# Patient Record
Sex: Female | Born: 1985 | Race: White | Hispanic: No | Marital: Single | Smoking: Current every day smoker
Health system: Southern US, Community
[De-identification: ages and names within clinical notes are randomized; demographics above are authoritative.]

## PROBLEM LIST (undated history)

## (undated) DIAGNOSIS — F32A Depression, unspecified: Secondary | ICD-10-CM

## (undated) DIAGNOSIS — F329 Major depressive disorder, single episode, unspecified: Secondary | ICD-10-CM

---

## 2007-04-11 ENCOUNTER — Encounter: Payer: Self-pay | Admitting: Family Medicine

## 2007-04-11 ENCOUNTER — Ambulatory Visit: Payer: Self-pay | Admitting: Family Medicine

## 2007-04-11 LAB — CONVERTED CEMR LAB
Basophils Absolute: 0 10*3/uL (ref 0.0–0.1)
Eosinophils Absolute: 0 10*3/uL (ref 0.0–0.7)
Eosinophils Relative: 0 % (ref 0–5)
HCT: 38.6 % (ref 36.0–46.0)
Hemoglobin: 12.9 g/dL (ref 12.0–15.0)
Lymphocytes Relative: 24 % (ref 12–46)
Lymphs Abs: 2 10*3/uL (ref 0.7–4.0)
MCV: 87.5 fL (ref 78.0–100.0)
Monocytes Absolute: 0.5 10*3/uL (ref 0.1–1.0)
RDW: 12.8 % (ref 11.5–15.5)
Rh Type: POSITIVE
Sickle Cell Screen: NEGATIVE

## 2007-04-18 ENCOUNTER — Other Ambulatory Visit: Admission: RE | Admit: 2007-04-18 | Discharge: 2007-04-18 | Payer: Self-pay | Admitting: Family Medicine

## 2007-04-18 ENCOUNTER — Encounter: Payer: Self-pay | Admitting: Family Medicine

## 2007-04-18 ENCOUNTER — Ambulatory Visit: Payer: Self-pay | Admitting: Sports Medicine

## 2007-04-18 DIAGNOSIS — F17200 Nicotine dependence, unspecified, uncomplicated: Secondary | ICD-10-CM | POA: Insufficient documentation

## 2007-04-19 ENCOUNTER — Ambulatory Visit (HOSPITAL_COMMUNITY): Admission: RE | Admit: 2007-04-19 | Discharge: 2007-04-19 | Payer: Self-pay | Admitting: Family Medicine

## 2007-04-19 ENCOUNTER — Encounter: Payer: Self-pay | Admitting: Family Medicine

## 2007-04-25 ENCOUNTER — Encounter: Payer: Self-pay | Admitting: Family Medicine

## 2007-05-17 ENCOUNTER — Ambulatory Visit: Payer: Self-pay | Admitting: Family Medicine

## 2007-05-17 LAB — CONVERTED CEMR LAB
Glucose, Urine, Semiquant: NEGATIVE
Protein, U semiquant: NEGATIVE

## 2007-05-22 ENCOUNTER — Encounter: Payer: Self-pay | Admitting: Family Medicine

## 2007-06-20 ENCOUNTER — Ambulatory Visit: Payer: Self-pay | Admitting: Family Medicine

## 2007-06-20 LAB — CONVERTED CEMR LAB: Glucose, Urine, Semiquant: NEGATIVE

## 2007-07-19 ENCOUNTER — Ambulatory Visit: Payer: Self-pay | Admitting: Family Medicine

## 2007-07-19 LAB — CONVERTED CEMR LAB: Protein, U semiquant: NEGATIVE

## 2007-07-21 ENCOUNTER — Encounter: Payer: Self-pay | Admitting: Family Medicine

## 2007-07-21 ENCOUNTER — Ambulatory Visit: Payer: Self-pay | Admitting: Family Medicine

## 2007-07-21 LAB — CONVERTED CEMR LAB
MCHC: 32.7 g/dL (ref 30.0–36.0)
MCV: 92.7 fL (ref 78.0–100.0)
Platelets: 277 10*3/uL (ref 150–400)
RDW: 14.8 % (ref 11.5–15.5)

## 2007-07-22 ENCOUNTER — Encounter: Payer: Self-pay | Admitting: Family Medicine

## 2007-08-15 ENCOUNTER — Ambulatory Visit: Payer: Self-pay | Admitting: Family Medicine

## 2007-09-14 ENCOUNTER — Ambulatory Visit: Payer: Self-pay | Admitting: Family Medicine

## 2007-09-21 ENCOUNTER — Ambulatory Visit: Payer: Self-pay | Admitting: Family Medicine

## 2007-09-21 ENCOUNTER — Encounter: Payer: Self-pay | Admitting: Family Medicine

## 2007-09-21 LAB — CONVERTED CEMR LAB
Chlamydia, DNA Probe: NEGATIVE
Glucose, Urine, Semiquant: NEGATIVE

## 2007-09-22 ENCOUNTER — Encounter: Payer: Self-pay | Admitting: Family Medicine

## 2007-09-26 ENCOUNTER — Ambulatory Visit (HOSPITAL_COMMUNITY): Admission: RE | Admit: 2007-09-26 | Discharge: 2007-09-26 | Payer: Self-pay | Admitting: Family Medicine

## 2007-09-28 ENCOUNTER — Ambulatory Visit: Payer: Self-pay | Admitting: Family Medicine

## 2007-09-28 LAB — CONVERTED CEMR LAB
Glucose, Urine, Semiquant: NEGATIVE
Protein, U semiquant: NEGATIVE

## 2007-09-29 ENCOUNTER — Encounter: Payer: Self-pay | Admitting: Family Medicine

## 2007-10-06 ENCOUNTER — Ambulatory Visit: Payer: Self-pay | Admitting: Family Medicine

## 2007-10-06 ENCOUNTER — Encounter: Payer: Self-pay | Admitting: Family Medicine

## 2007-10-13 ENCOUNTER — Inpatient Hospital Stay (HOSPITAL_COMMUNITY): Admission: AD | Admit: 2007-10-13 | Discharge: 2007-10-13 | Payer: Self-pay | Admitting: Obstetrics & Gynecology

## 2007-10-13 ENCOUNTER — Ambulatory Visit: Payer: Self-pay | Admitting: Family Medicine

## 2007-10-13 LAB — CONVERTED CEMR LAB: Glucose, Urine, Semiquant: NEGATIVE

## 2007-10-17 ENCOUNTER — Ambulatory Visit: Payer: Self-pay | Admitting: Family Medicine

## 2007-10-19 ENCOUNTER — Inpatient Hospital Stay (HOSPITAL_COMMUNITY): Admission: AD | Admit: 2007-10-19 | Discharge: 2007-10-19 | Payer: Self-pay | Admitting: Obstetrics & Gynecology

## 2007-10-19 ENCOUNTER — Ambulatory Visit: Payer: Self-pay | Admitting: Obstetrics & Gynecology

## 2007-10-20 ENCOUNTER — Inpatient Hospital Stay (HOSPITAL_COMMUNITY): Admission: AD | Admit: 2007-10-20 | Discharge: 2007-10-23 | Payer: Self-pay | Admitting: Obstetrics & Gynecology

## 2007-10-20 ENCOUNTER — Ambulatory Visit: Payer: Self-pay | Admitting: Advanced Practice Midwife

## 2007-11-27 ENCOUNTER — Ambulatory Visit: Payer: Self-pay | Admitting: Family Medicine

## 2007-11-27 LAB — CONVERTED CEMR LAB: Beta hcg, urine, semiquantitative: NEGATIVE

## 2007-12-13 ENCOUNTER — Encounter: Payer: Self-pay | Admitting: Family Medicine

## 2008-01-02 ENCOUNTER — Ambulatory Visit: Payer: Self-pay | Admitting: Family Medicine

## 2008-02-05 ENCOUNTER — Encounter: Payer: Self-pay | Admitting: Family Medicine

## 2008-02-09 ENCOUNTER — Telehealth: Payer: Self-pay | Admitting: *Deleted

## 2008-05-08 ENCOUNTER — Encounter: Payer: Self-pay | Admitting: Family Medicine

## 2008-05-08 ENCOUNTER — Ambulatory Visit: Payer: Self-pay | Admitting: Family Medicine

## 2008-05-08 LAB — CONVERTED CEMR LAB: Rapid Strep: NEGATIVE

## 2008-11-05 ENCOUNTER — Emergency Department (HOSPITAL_COMMUNITY): Admission: EM | Admit: 2008-11-05 | Discharge: 2008-11-05 | Payer: Self-pay | Admitting: Emergency Medicine

## 2009-03-12 ENCOUNTER — Ambulatory Visit: Payer: Self-pay | Admitting: Family Medicine

## 2009-03-12 ENCOUNTER — Encounter: Payer: Self-pay | Admitting: Family Medicine

## 2009-03-12 DIAGNOSIS — F330 Major depressive disorder, recurrent, mild: Secondary | ICD-10-CM

## 2009-03-12 LAB — CONVERTED CEMR LAB
MCHC: 32 g/dL (ref 30.0–36.0)
RBC: 4.66 M/uL (ref 3.87–5.11)
TSH: 1.242 microintl units/mL (ref 0.350–4.500)
Vitamin B-12: 520 pg/mL (ref 211–911)
WBC: 11.1 10*3/uL — ABNORMAL HIGH (ref 4.0–10.5)

## 2009-03-13 ENCOUNTER — Telehealth: Payer: Self-pay | Admitting: Family Medicine

## 2009-04-04 ENCOUNTER — Ambulatory Visit: Payer: Self-pay | Admitting: Family Medicine

## 2009-05-07 ENCOUNTER — Telehealth: Payer: Self-pay | Admitting: *Deleted

## 2009-11-20 ENCOUNTER — Telehealth: Payer: Self-pay | Admitting: Family Medicine

## 2010-02-10 NOTE — Assessment & Plan Note (Signed)
Summary: depression   Vital Signs:  Patient profile:   25 year old female Height:      62 inches Weight:      165.7 pounds BMI:     30.42 Temp:     98.0 degrees F oral Pulse rate:   69 / minute BP sitting:   137 / 84  (left arm) Cuff size:   regular  Vitals Entered By: Gladstone Pih (April 04, 2009 11:26 AM) CC: depression Is Patient Diabetic? No Pain Assessment Patient in pain? no        Primary Care Provider:  Romero Belling MD  CC:  depression.  History of Present Illness: Taking Citalopram as prescribed without side effects--currently taking 20 mg daily.  Filled Clonazepam prescription but has not taken any.  Feels mood is much improved.  Denies sadness, crying, irritability, anger, anhedonia, sleep disturbance, suicidal ideation.  Is enjoying her 65 mo son "LD" (Little Dwayne).  Habits & Providers  Alcohol-Tobacco-Diet     Tobacco Status: current     Tobacco Counseling: to quit use of tobacco products     Cigarette Packs/Day: <0.25  Current Medications (verified): 1)  Sprintec 28 0.25-35 Mg-Mcg Tabs (Norgestimate-Eth Estradiol) .Marland Kitchen.. 1 Tab Daily As Directed On Package.  Disp #1 Package. 2)  Citalopram Hydrobromide 20 Mg Tabs (Citalopram Hydrobromide) .Marland Kitchen.. 1 Tab By Mouth Daily 3)  Clonazepam 1 Mg Tabs (Clonazepam) .Marland Kitchen.. 1 Tab By Mouth Two Times A Day As Needed For Anxiety  Allergies (verified): No Known Drug Allergies  Social History: Packs/Day:  <0.25  Physical Exam  General:  Well-developed,well-nourished,in no acute distress; alert,appropriate and cooperative throughout examination Psych:  Cognition and judgment appear intact. Alert and cooperative with normal attention span and concentration. No apparent delusions, illusions, hallucinations.  Not anxious appearing and not depressed appearing.   Impression & Recommendations:  Problem # 1:  DEPRESSION, MAJOR, INITIAL EPISODE (ICD-296.20) Assessment Improved  Started treatment with Citalopram 1 month ago.   Is basically asymptomatic at this point.  This is a very fast response to medication, so wonder if something else changed.  Did start OCPs 1 month ago--perhaps hormones were contributing?  Either way, would continue treatment for 9-12 months, then wean Citalopram.  No use of Clonazepam, so would not refill.  Orders: [facility]- Est Level  2 (16109)  Problem # 2:  Preventive Health Care (ICD-V70.0) Assessment: Comment Only RTC 2-3 months for Pap.  Complete Medication List: 1)  Sprintec 28 0.25-35 Mg-mcg Tabs (Norgestimate-eth estradiol) .Marland Kitchen.. 1 tab daily as directed on package.  disp #1 package. 2)  Citalopram Hydrobromide 20 Mg Tabs (Citalopram hydrobromide) .Marland Kitchen.. 1 tab by mouth daily

## 2010-02-10 NOTE — Assessment & Plan Note (Signed)
Summary: depression, birth control   Vital Signs:  Patient profile:   25 year old female Height:      62 inches Weight:      166 pounds BMI:     30.47 BSA:     1.77 Temp:     98.6 degrees F Pulse rate:   74 / minute BP sitting:   123 / 78  Vitals Entered By: Jone Baseman CMA (March 12, 2009 2:23 PM) CC: depression, birth control Is Patient Diabetic? No Pain Assessment Patient in pain? no        CC:  depression and birth control.  History of Present Illness: 25 yo female:  DEPRESSION/ANXIETY.  3 month history of depressed mood and anxiety with frequent crying.  She states it started suddenly and initially she sat in bed for 2 weeks.  Associated with decreased motivation and insomnia.  Denies anhedonia, appetite changes, manic symptoms.  She endorses thoughts of self harm involving cutting herself, but hasn't done this.  Does not have an active suicide plan and states she would never harm herself.  When asked what she would do if these feelings became more intense, she states she would tell Dwayne (her partner).  See PHQ9 and GAD7 in physical exam.  BIRTH CONTROL.  Does not want to have another baby any time in the near future.  Has not been using OCPs for several months and she is sexually active with one partner and they are not using condoms.  She has regular periods every month that last approximately 7 days, and she is at beginning of period right now and UPT in clinic is negative today.  Does not want Depo or IUD.  Is requesting refill of OCPs today.  The reason she stopped taking in the past was financial.  ** GREATER THAN 25 MINUTES SPENT, MORE THAN 50% IN FACE-TO-FACE COUNSELLING. `  Habits & Providers  Alcohol-Tobacco-Diet     Tobacco Status: current     Cigarette Packs/Day: occ  Allergies (verified): No Known Drug Allergies  Physical Exam  Additional Exam:  VITALS:  Reviewed, normal GEN: Alert & oriented, no acute distress NECK: Midline trachea, no  masses/thyromegaly, no cervical lymphadenopathy CARDIO: Regular rate and rhythm, no murmurs/rubs/gallops, 2+ bilateral radial pulses RESP: Clear to auscultation, normal work of breathing, no retractions/accessory muscle use ABD: Normoactive bowel sounds, nontender, no masses/hepatosplenomegaly EXT: Nontender, no edema PSYCH:  Normal affect, mood = "sad"  PHQ 9 1 - 1 2 - 1 3 - 3 4 - 1 5 - 1 6 - 1 7 - 0 8 - 1 9 - 1  SUICIDAL?? -- NO TOTAL:  10 10 - DIFFICULTY = SOMEWHAT  GAD 7 1 - 1 2 - 1 3 - 1 4 - 1 5 - 1 6 - 1 7 - 1 TOTAL:  7 DIFFICULTY = SOMEWHAT    Impression & Recommendations:  Problem # 1:  MOOD DISORDER (ICD-296.90) Assessment New Likely major depressive disorder, 1st episode.  Possible anxiety component.  PHQ9 = 10, GAD7 = 7.  Check following labs:  TSH, CBC, B12.  Initiate Citalopram with Clonazepam bridge.  Discussed delayed onset of Citalopram effets on mood, as well as increased risk of suicidality and mania with Citalopram.  Discussed need to taper off Clonazepam as soon as possible when Citalopram is therapeutic.  Follow up in 2 weeks.  Orders: [facility]- Est  Level 4 (02725)  Problem # 2:  UNSPECIFIED CONTRACEPTIVE MANAGEMENT (ICD-V25.9) Assessment: Deteriorated Does not want  IUD or Depo.  Does not want more children for several years.  Refilled OCPs today.  Is an intermittent smoker, so this is not ideal, but given age is acceptable.  Will discuss more long term methods of birth control when emotionally more stable. Orders: [facility]- Est  Level 4 (99214)  Complete Medication List: 1)  Sprintec 28 0.25-35 Mg-mcg Tabs (Norgestimate-eth estradiol) .Marland Kitchen.. 1 tab daily as directed on package.  disp #1 package. 2)  Citalopram Hydrobromide 20 Mg Tabs (Citalopram hydrobromide) .Marland Kitchen.. 1 tab by mouth daily 3)  Clonazepam 1 Mg Tabs (Clonazepam) .Marland Kitchen.. 1 tab by mouth two times a day as needed for anxiety  Other Orders: CBC-[facility] (16109) B12-[facility] ([address]11) TSH-[facility]  ([address]78-29[address]) U Preg-[facility] (13086)  Patient Instructions: 1)  Start Citalopram today:  1/2 tablet for the first 6 days, then 1 tablet every day. 2)  Use Clonazepam as needed, try not to use every day.  This is a medicine that we will want to stop sometime in the next few weeks. 3)  Start birth control pills on Sunday. 4)  Please schedule a follow-up appointment in 2 weeks.  Prescriptions: CLONAZEPAM 1 MG TABS (CLONAZEPAM) 1 tab by mouth two times a day as needed for anxiety  #60 x 0   Entered and Authorized by:   Tihanna Goodson MD   Signed by:   Lashona Schaaf MD on 03/12/2009   Method used:   Print then Give to Patient   RxID:   [id number] SPRINTEC 28 0.25-35 MG-MCG TABS (NORGESTIMATE-ETH ESTRADIOL) 1 tab daily as directed on package.  Disp #1 package.  #1 x 11   Entered and Authorized by:   Raffaella Edison MD   Signed by:   Masud Holub MD on 03/12/2009   Method used:   Electronically to        [facility]  [address] (retail)       [address]       [place]       [place],   [address]       Ph: [phone]       Fax: [phone]   RxID:   [id number] CITALOPRAM HYDROBROMIDE 20 MG TABS (CITALOPRAM HYDROBROMIDE) 1 tab by mouth daily  #30 x 1   Entered and Authorized by:   Yuvan Medinger MD   Signed by:   Breton Berns MD on 03/12/2009   Method used:   Electronically to        [facility]  [address] (retail)       [address]       [place], [place]  [address]       Ph: [phone]       Fax: [phone]   RxID:   [id number]   Laboratory Results   Urine Tests  Date/Time Received: March 12, 2009 3:57 PM  Date/Time Reported: March 12, 2009 4:53 PM     Urine HCG: negative Comments: ...............test performed by......Marland KitchenBonnie A. [place], MLS (ASCP)cm

## 2010-02-10 NOTE — Progress Notes (Signed)
Summary: refill  Phone Note Refill Request Call back at [phone] Message from:  Patient  Refills Requested: Medication #1:  CITALOPRAM HYDROBROMIDE 20 MG TABS 1 tab by mouth daily. Initial call taken by: De Nurse,  November 20, 2009 11:05 AM    Prescriptions: CITALOPRAM HYDROBROMIDE 20 MG TABS (CITALOPRAM HYDROBROMIDE) 1 tab by mouth daily  #30 x 6   Entered and Authorized by:   Angelena Sole MD   Signed by:   Angelena Sole MD on 11/20/2009   Method used:   Electronically to        [facility] (retail)       [address]       [place], [place]  [address]       Ph: [phone]       Fax: [phone]   RxID:   [id number]
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
## 2010-02-10 NOTE — Progress Notes (Signed)
Summary: refill  Phone Note Refill Request Call back at [phone] Message from:  Patient  Refills Requested: Medication #1:  CITALOPRAM HYDROBROMIDE 20 MG TABS 1 tab by mouth daily. Initial call taken by: De Nurse,  May 07, 2009 12:18 PM  Follow-up for Phone Call        [facility]--[facility]  Please call patient. Follow-up by: Romero Belling MD,  May 07, 2009 12:30 PM  Additional Follow-up for Phone Call Additional follow up Details #1::        Pt informed Additional Follow-up by: Jone Baseman CMA,  May 07, 2009 1:34 PM    Prescriptions: CITALOPRAM HYDROBROMIDE 20 MG TABS (CITALOPRAM HYDROBROMIDE) 1 tab by mouth daily  #30 x 6   Entered and Authorized by:   Romero Belling MD   Signed by:   Romero Belling MD on 05/07/2009   Method used:   Electronically to        [facility] (retail)       [address]       [place], [place]  [address]       Ph: [phone]       Fax: [phone]   RxID:   [id number]

## 2010-06-30 ENCOUNTER — Ambulatory Visit (INDEPENDENT_AMBULATORY_CARE_PROVIDER_SITE_OTHER): Payer: Medicaid Other | Admitting: Family Medicine

## 2010-06-30 ENCOUNTER — Encounter: Payer: Self-pay | Admitting: Family Medicine

## 2010-06-30 VITALS — BP 119/73 | HR 79 | Temp 97.7°F | Wt 197.0 lb

## 2010-06-30 DIAGNOSIS — F329 Major depressive disorder, single episode, unspecified: Secondary | ICD-10-CM

## 2010-06-30 DIAGNOSIS — F17200 Nicotine dependence, unspecified, uncomplicated: Secondary | ICD-10-CM

## 2010-06-30 DIAGNOSIS — Z309 Encounter for contraceptive management, unspecified: Secondary | ICD-10-CM

## 2010-06-30 DIAGNOSIS — F419 Anxiety disorder, unspecified: Secondary | ICD-10-CM | POA: Insufficient documentation

## 2010-06-30 DIAGNOSIS — Z3042 Encounter for surveillance of injectable contraceptive: Secondary | ICD-10-CM | POA: Insufficient documentation

## 2010-06-30 DIAGNOSIS — F411 Generalized anxiety disorder: Secondary | ICD-10-CM

## 2010-06-30 MED ORDER — MEDROXYPROGESTERONE ACETATE 150 MG/ML IM SUSP
150.0000 mg | Freq: Once | INTRAMUSCULAR | Status: AC
  Administered 2010-06-30: 150 mg via INTRAMUSCULAR

## 2010-06-30 MED ORDER — MEDROXYPROGESTERONE ACETATE 150 MG/ML IM SUSP: 150.0000 mg | INTRAMUSCULAR | Status: DC

## 2010-06-30 MED ORDER — CITALOPRAM HYDROBROMIDE 40 MG PO TABS: 40.0000 mg | ORAL_TABLET | Freq: Every day | ORAL | Status: DC

## 2010-06-30 NOTE — Assessment & Plan Note (Signed)
Encouraged her to quit.  She will try.

## 2010-06-30 NOTE — Assessment & Plan Note (Signed)
Relatively stable.  Still having some thoughts of self harm.  Will increase dose of Citalopram.

## 2010-06-30 NOTE — Patient Instructions (Signed)
It was good to see you today We will increase the Citalopram to 40mg  daily.  This should help with the depression and the anxiety. We will start you on Depo today. Please schedule a follow up appointment when convenient for your Pap smear. Please schedule a follow up appointment in 3 months to check on your mood and for your next Depo injection.

## 2010-06-30 NOTE — Assessment & Plan Note (Signed)
Has had this before.  Sounds like she was on a benzo.  Will try increased dose of Celexa and see if that helps.

## 2010-06-30 NOTE — Progress Notes (Signed)
  Subjective:    Patient ID: Cassandra Bass, female    DOB: 1985/06/20, 25 y.o.   MRN: [id number]  HPI 1. Depression:  It is relatively stable.  She still endorses occassional thoughts of self harm.  Denies any SI.    She has a good support system and it helps to talk to people about her issues  2. Anxiety:  She is starting to have more anxiety problems.  She was started on another medication for this about 1.5 years ago and it made a big difference.  She is interested in starting that again.  3. Birth control:  She is not taking anything would like to start Depo.  She is on her period now.  4. Tobacco use:  She only smokes 1 or 2 times a week.  Only smokes when people around her smoke.  She is interested in quitting.   Review of Systems Denies SI.  Denies problems with sleep.  Endorses low energy.  Denies tachycardia.  Denies shortness of breath or cough    Objective:   Physical Exam  Vitals reviewed. Constitutional: She appears well-nourished. No distress.       Obese   HENT:  Mouth/Throat: Oropharynx is clear and moist. No oropharyngeal exudate.  Neck: Normal range of motion. Neck supple. No thyromegaly present.  Cardiovascular: Normal rate, regular rhythm and normal heart sounds.   Pulmonary/Chest: Effort normal and breath sounds normal. No respiratory distress. She has no wheezes.  Abdominal: Soft. Bowel sounds are normal. She exhibits no distension. There is no tenderness.  Musculoskeletal: She exhibits no edema.  Skin: Skin is warm and dry.  Psychiatric: She has a normal mood and affect.       Full affect          Assessment & Plan:

## 2010-10-13 LAB — CBC
MCHC: 32.7
RBC: 4.64
RDW: 13.6

## 2010-10-13 LAB — RPR: RPR Ser Ql: NONREACTIVE

## 2010-11-12 NOTE — Progress Notes (Signed)
  Subjective:    Patient ID: Cassandra Bass, female    DOB: 09/05/1985, 25 y.o.   MRN: [id number]  HPI   Review of Systems     Objective:   Physical Exam        Assessment & Plan:

## 2010-11-13 ENCOUNTER — Ambulatory Visit (INDEPENDENT_AMBULATORY_CARE_PROVIDER_SITE_OTHER): Payer: Self-pay | Admitting: Family Medicine

## 2010-11-13 DIAGNOSIS — Z0000 Encounter for general adult medical examination without abnormal findings: Secondary | ICD-10-CM

## 2010-11-23 ENCOUNTER — Ambulatory Visit: Payer: Self-pay

## 2010-12-16 ENCOUNTER — Encounter: Payer: Self-pay | Admitting: Family Medicine

## 2011-04-03 ENCOUNTER — Encounter (HOSPITAL_COMMUNITY): Payer: Self-pay | Admitting: Family Medicine

## 2011-04-03 ENCOUNTER — Emergency Department (HOSPITAL_COMMUNITY)
Admission: EM | Admit: 2011-04-03 | Discharge: 2011-04-03 | Disposition: A | Discharge status: Other Acute Inpatient Hospital | Payer: Self-pay | Attending: Emergency Medicine | Admitting: Emergency Medicine

## 2011-04-03 DIAGNOSIS — Z79899 Other long term (current) drug therapy: Secondary | ICD-10-CM | POA: Insufficient documentation

## 2011-04-03 DIAGNOSIS — F1010 Alcohol abuse, uncomplicated: Secondary | ICD-10-CM | POA: Insufficient documentation

## 2011-04-03 DIAGNOSIS — R456 Violent behavior: Secondary | ICD-10-CM

## 2011-04-03 DIAGNOSIS — F17200 Nicotine dependence, unspecified, uncomplicated: Secondary | ICD-10-CM | POA: Insufficient documentation

## 2011-04-03 DIAGNOSIS — F10929 Alcohol use, unspecified with intoxication, unspecified: Secondary | ICD-10-CM

## 2011-04-03 HISTORY — DX: Depression, unspecified: F32.A

## 2011-04-03 HISTORY — DX: Major depressive disorder, single episode, unspecified: F32.9
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
## 2011-04-03 NOTE — ED Notes (Signed)
Patient ambulated to the bathroom. Urine specimen cup given to patient. Patient refused to provide urine specimen.
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
## 2011-04-03 NOTE — ED Notes (Signed)
Dr. Molpus at bedside. 

## 2011-04-03 NOTE — Discharge Instructions (Signed)
 Alcohol Intoxication   You have alcohol intoxication when the amount of alcohol that you have consumed has impaired your ability to mentally and physically function. There are a variety of factors that contribute to the level at which alcohol intoxication can occur, such as age, gender, weight, frequency of alcohol consumption, medication use, and the presence of other medical conditions, such as diabetes, seizures, or heart conditions.   The blood alcohol level test measures the concentration of alcohol in your blood. In most states, your blood alcohol level must be lower than 80 mg/dL (1.61%) to legally drive. However, many dangerous effects of alcohol can occur at much lower levels.   Alcohol directly impairs the normal chemical activity of the brain and is said to be a chemical depressant. Alcohol can cause drowsiness, stupor, respiratory failure, and coma. Other physical effects can include headache, vomiting, vomiting of blood, abdominal pain, a fast heartbeat, difficulty breathing, anxiety, and amnesia. Alcohol intoxication can also lead to dangerous and life-threatening activities, such as fighting, dangerous operation of vehicles or heavy machinery, and risky sexual behavior.   Alcohol can be especially dangerous when taken with other drugs. Some of these drugs are:   Sedatives.   Painkillers.   Marijuana.   Tranquilizers.   Antihistamines.   Muscle relaxants.   Seizure medicine.  Many of the effects of acute alcohol intoxication are temporary. However, repeated alcohol intoxication can lead to severe medical illnesses. If you have alcohol intoxication, you should:   Stay hydrated. Drink enough water and fluids to keep your urine clear or pale yellow. Avoid excessive caffeine because this can further lead to dehydration.   Eat a healthy diet. You may have residual nausea, headache, and loss of appetite, but it is still important that you maintain good nutrition. You can start with clear liquids.   Take  nonsteroidal anti-inflammatory medications as needed for headaches, but make sure to do so with small meals. You should avoid acetaminophen for several days after having alcohol intoxication because the combination of alcohol and acetaminophen can be toxic to your liver.  If you have frequent alcohol intoxication, ask your friends and family if they think you have a drinking problem. For further help, contact:   Your caregiver.   Alcoholics Anonymous (AA).   A drug or alcohol rehabilitation program.  SEEK MEDICAL CARE IF:   You have persistent vomiting.   You have persistent pain in any part of your body.   You do not feel better after a few days.  SEEK IMMEDIATE MEDICAL CARE IF:   You become shaky or tremble when you try to stop drinking.   You shake uncontrollably (seizure).   You throw up (vomit) blood. This may be bright red or it may look like black coffee grounds.   You have blood in the stool. This may be bright red or appear as a black, tarry, bad smelling stool.   You become lightheaded or faint.  ANY OF THESE SYMPTOMS MAY REPRESENT A SERIOUS PROBLEM THAT IS AN EMERGENCY. Do not wait to see if the symptoms will go away. Get medical help right away. Call your local emergency services (911 in [place] DO NOT drive yourself to the hospital.   MAKE SURE YOU:   Understand these instructions.   Will watch your condition.   Will get help right away if you are not doing well or get worse.  Document Released: 10/07/2004 Document Revised: 12/17/2010 Document Reviewed: 06/16/2009   [facility] Patient Information 2012 [place], [place].

## 2011-04-05 ENCOUNTER — Telehealth: Payer: Self-pay | Admitting: Family Medicine

## 2011-04-05 DIAGNOSIS — F329 Major depressive disorder, single episode, unspecified: Secondary | ICD-10-CM

## 2011-04-05 MED ORDER — CITALOPRAM HYDROBROMIDE 40 MG PO TABS: 40.0000 mg | ORAL_TABLET | Freq: Every day | ORAL | Status: DC

## 2011-04-05 NOTE — Telephone Encounter (Signed)
Dr. Armen Pickup is on vacation . Will check with preceptor about sending in new RX.

## 2011-04-05 NOTE — Telephone Encounter (Signed)
Will forward message to Dr. Funches .  

## 2011-04-05 NOTE — Telephone Encounter (Signed)
Patient is calling because she left what she had left of her Citalopram at her dads who lives [place] of [place].  She went to her pharmacy yesterday but they said she didn't have anymore refills left.  She needs a new Rx for her medication and is concerned because she feel funny and like she could have an anxiety attack.

## 2011-04-05 NOTE — Telephone Encounter (Signed)
Dr. Mauricio Po advises may send in .  Advised patient to schedule appointment before next refill is needed.

## 2011-05-03 ENCOUNTER — Telehealth: Payer: Self-pay | Admitting: Family Medicine

## 2011-05-03 DIAGNOSIS — F329 Major depressive disorder, single episode, unspecified: Secondary | ICD-10-CM

## 2011-05-03 MED ORDER — CITALOPRAM HYDROBROMIDE 40 MG PO TABS: 40.0000 mg | ORAL_TABLET | Freq: Every day | ORAL | Status: DC

## 2011-05-03 NOTE — Telephone Encounter (Signed)
Patient notified that enough was sent in to last until appointment.

## 2011-05-03 NOTE — Telephone Encounter (Signed)
Patient needs a refill on her Celexa sent to [facility].  She has an appt with Dr. Armen Pickup on 5/3.  Please call patient if there are any question or to let her know that this has been completed.

## 2011-05-14 ENCOUNTER — Encounter: Payer: Self-pay | Admitting: Family Medicine

## 2011-05-14 ENCOUNTER — Ambulatory Visit (INDEPENDENT_AMBULATORY_CARE_PROVIDER_SITE_OTHER): Payer: Medicaid Other | Admitting: Family Medicine

## 2011-05-14 VITALS — BP 135/80

## 2011-05-14 DIAGNOSIS — F411 Generalized anxiety disorder: Secondary | ICD-10-CM

## 2011-05-14 DIAGNOSIS — F329 Major depressive disorder, single episode, unspecified: Secondary | ICD-10-CM

## 2011-05-14 DIAGNOSIS — F419 Anxiety disorder, unspecified: Secondary | ICD-10-CM

## 2011-05-14 MED ORDER — CITALOPRAM HYDROBROMIDE 40 MG PO TABS: 40.0000 mg | ORAL_TABLET | Freq: Every day | ORAL | Status: DC

## 2011-05-14 MED ORDER — BUPROPION HCL 100 MG PO TABS: 100.0000 mg | ORAL_TABLET | Freq: Two times a day (BID) | ORAL | Status: DC

## 2011-05-14 NOTE — Progress Notes (Signed)
Subjective:     Patient ID: Cassandra Bass, female   DOB: 1985-01-17, 26 y.o.   MRN: [id number]  HPI 26 yo G here for depression and anxiety f/u:  1. Depression: taking celexa 40. Admits to thoughts of suicide. Denies plan. Sleeps poorly about 4-5 hrs per night. Interrupted. Enjoys spending time with family and friends. Has support from son and mom. Smokes 2 cigarettes per day. Drinks 2 beers per week. Denies illicit drug use.   2. Anxiety: has attacks randomly. Tried xanax when she was initially diagnosed which helped. Has not taken xanax recently. Admits to CP, SOB, sweating during attacks.   Review of Systems As per HPI    Objective:   Physical Exam There were no vitals taken for this visit. General appearance: alert, cooperative and no distress Psych: PHQ- 9 filled out. Score 8. Score of 1 to question 1, 2, 4, 6, and 9. Score of 3 to question 3.     Assessment and Plan:

## 2011-05-14 NOTE — Assessment & Plan Note (Signed)
A: persistent. Patient interested in restarting BZ.  P: -wellbutrin -discouraged BZ, will not prescribe given random onset of attacks. I suspect that she will end of taking them more often than desired like daily.  -encouraged physical activity -f/u in 2-4 weeks.

## 2011-05-14 NOTE — Patient Instructions (Signed)
Cassandra Bass,  Thank you very much for coming to see me today. It was a pleasure meeting you.  Please continue Celexa 40 mg daily.  Start Wellbutrin 100 mg twice daily, after 3 days you can increase to three times daily if you wish.  In addition: continue healthy weight loss, be active outdoors with family, make sure you eat well. Do you best to stop smoking all together. Limit alcohol.   F/u with me in 2-4 weeks  Dr. Armen Pickup

## 2011-05-14 NOTE — Assessment & Plan Note (Signed)
A: major depression not yet in remittance. Biggest problems are insomnia, suicidal thoughts and concomitant anxiety. P: -start wellbutrin -patient declines therapy at this time -encouraged physical activity and enjoyable stimulating activities -f/u in 2-4 weeks.

## 2011-05-17 ENCOUNTER — Telehealth: Payer: Self-pay | Admitting: Family Medicine

## 2011-05-17 NOTE — Telephone Encounter (Signed)
Will forward message to MD.

## 2011-05-17 NOTE — Telephone Encounter (Signed)
States that the Wellbutrin is too expensive and needs something cheaper  [facility] [place] [place]

## 2011-05-20 MED ORDER — AMITRIPTYLINE HCL 75 MG PO TABS: 75.0000 mg | ORAL_TABLET | Freq: Every day | ORAL | Status: DC

## 2011-05-20 NOTE — Telephone Encounter (Signed)
Called patient. Changed to elavil. Patient can pick up elavil at her convenience. Will keep dose low since she is also taking celexa. Warned against dizziness/lightheadedness at night.

## 2011-06-03 ENCOUNTER — Ambulatory Visit: Payer: Self-pay | Admitting: Family Medicine

## 2011-06-11 ENCOUNTER — Encounter: Payer: Self-pay | Admitting: Family Medicine

## 2011-09-16 ENCOUNTER — Encounter: Payer: Self-pay | Admitting: Family Medicine

## 2011-09-30 ENCOUNTER — Ambulatory Visit
Admission: RE | Admit: 2011-09-30 | Discharge: 2011-09-30 | Disposition: A | Discharge status: Home / Self Care | Payer: Medicaid Other | Source: Ambulatory Visit | Attending: Family Medicine | Admitting: Family Medicine

## 2011-09-30 ENCOUNTER — Other Ambulatory Visit: Payer: Self-pay | Admitting: Family Medicine

## 2011-09-30 DIAGNOSIS — M79609 Pain in unspecified limb: Secondary | ICD-10-CM

## 2011-09-30 DIAGNOSIS — M545 Low back pain: Secondary | ICD-10-CM

## 2011-09-30 DIAGNOSIS — F17200 Nicotine dependence, unspecified, uncomplicated: Secondary | ICD-10-CM

## 2011-10-04 ENCOUNTER — Encounter: Payer: Self-pay | Admitting: Gastroenterology

## 2011-10-05 ENCOUNTER — Other Ambulatory Visit: Payer: Self-pay | Admitting: Family Medicine

## 2011-10-05 DIAGNOSIS — E049 Nontoxic goiter, unspecified: Secondary | ICD-10-CM

## 2011-10-08 ENCOUNTER — Ambulatory Visit
Admission: RE | Admit: 2011-10-08 | Discharge: 2011-10-08 | Disposition: A | Discharge status: Home / Self Care | Payer: Medicaid Other | Source: Ambulatory Visit | Attending: Family Medicine | Admitting: Family Medicine

## 2011-10-08 DIAGNOSIS — E049 Nontoxic goiter, unspecified: Secondary | ICD-10-CM

## 2011-11-03 ENCOUNTER — Ambulatory Visit: Payer: Medicaid Other | Admitting: Gastroenterology

## 2011-11-08 ENCOUNTER — Encounter: Payer: Medicaid Other | Admitting: Family Medicine

## 2012-01-26 ENCOUNTER — Other Ambulatory Visit (HOSPITAL_COMMUNITY)
Admission: RE | Admit: 2012-01-26 | Discharge: 2012-01-26 | Disposition: A | Discharge status: Home / Self Care | Payer: Medicaid Other | Source: Ambulatory Visit | Attending: Family Medicine | Admitting: Family Medicine

## 2012-01-26 ENCOUNTER — Encounter: Payer: Self-pay | Admitting: Family Medicine

## 2012-01-26 ENCOUNTER — Ambulatory Visit (INDEPENDENT_AMBULATORY_CARE_PROVIDER_SITE_OTHER): Payer: Medicaid Other | Admitting: Family Medicine

## 2012-01-26 VITALS — BP 100/67 | HR 62 | Temp 97.7°F | Ht 62.0 in | Wt 187.0 lb

## 2012-01-26 DIAGNOSIS — L708 Other acne: Secondary | ICD-10-CM

## 2012-01-26 DIAGNOSIS — Z0000 Encounter for general adult medical examination without abnormal findings: Secondary | ICD-10-CM

## 2012-01-26 DIAGNOSIS — Z124 Encounter for screening for malignant neoplasm of cervix: Secondary | ICD-10-CM

## 2012-01-26 DIAGNOSIS — M545 Low back pain: Secondary | ICD-10-CM

## 2012-01-26 DIAGNOSIS — L709 Acne, unspecified: Secondary | ICD-10-CM

## 2012-01-26 DIAGNOSIS — F330 Major depressive disorder, recurrent, mild: Secondary | ICD-10-CM

## 2012-01-26 DIAGNOSIS — Z01419 Encounter for gynecological examination (general) (routine) without abnormal findings: Secondary | ICD-10-CM | POA: Insufficient documentation

## 2012-01-26 DIAGNOSIS — Z113 Encounter for screening for infections with a predominantly sexual mode of transmission: Secondary | ICD-10-CM | POA: Insufficient documentation

## 2012-01-26 DIAGNOSIS — Z3049 Encounter for surveillance of other contraceptives: Secondary | ICD-10-CM

## 2012-01-26 DIAGNOSIS — Z3042 Encounter for surveillance of injectable contraceptive: Secondary | ICD-10-CM

## 2012-01-26 DIAGNOSIS — Z309 Encounter for contraceptive management, unspecified: Secondary | ICD-10-CM

## 2012-01-26 DIAGNOSIS — Z23 Encounter for immunization: Secondary | ICD-10-CM

## 2012-01-26 DIAGNOSIS — N898 Other specified noninflammatory disorders of vagina: Secondary | ICD-10-CM

## 2012-01-26 DIAGNOSIS — F17200 Nicotine dependence, unspecified, uncomplicated: Secondary | ICD-10-CM

## 2012-01-26 LAB — POCT URINE PREGNANCY: Preg Test, Ur: NEGATIVE

## 2012-01-26 MED ORDER — MEDROXYPROGESTERONE ACETATE 150 MG/ML IM SUSP
150.0000 mg | Freq: Once | INTRAMUSCULAR | Status: AC
  Administered 2012-01-26: 150 mg via INTRAMUSCULAR

## 2012-01-26 NOTE — Progress Notes (Signed)
Subjective:     Patient ID: Cassandra Bass, female   DOB: 06-23-1985, 27 y.o.   MRN: [id number]  HPI 27 yo F presents for physical and to discuss the following:  1. Low back pain: she was seen at [facility] clinic for primary care. She was noticed to have scoliosis and started on tylenol #3 for her chronic low back pain. She reports improvement in pain. She denies LE weakness, incontinence or injury to her back.   2.  Grief: her mother passed away recently from stage 4 breast cancer. She died before she start treatment. The patient is worried about her breast cancer risk. She has quit drinking and is working on quitting smoking. She was prescribed xanax at [facility] to help deal with her symptoms of grief and worsening insomnia.   3. Smoking: 1/3 PPD. Contemplative about quitting. Recently quit alcohol. Has intermittent SOB and CP on exertion.   4. Contraception: sexually active. Previously on depo but has not had depo in many months. Currently on her menses.   Review of Systems  General:  Positive for easy bruising. Negative for unexplained weight loss, fever Skin: Negative for new or changing mole, sore that won't heal HEENT: Negative for trouble hearing, trouble seeing, ringing in ears, mouth sores, hoarseness, change in voice, dysphagia. CV: Postive for chest pain, dyspnea, Negative for edema, palpitations Resp: Positive for cough, dyspnea. Negative for  hemoptysis GI: Positive for intermittent lower abdominal pain. Negative for nausea, vomiting, diarrhea, constipation, melena, hematochezia. GU: Negative for dysuria, incontinence, urinary hesitance, hematuria, vaginal or penile discharge, polyuria, sexual difficulty, lumps in testicle or breasts MSK: Positive for low back pain. Negative for joint pain or swelling Neuro: Positive for headaches and seeing spots before her eyes. Negative for weakness, numbness, dizziness, passing out/fainting Psych: Negative for depression,  anxiety, memory problems     Objective:   Physical Exam BP 100/67  Pulse 62  Temp 97.7 F (36.5 C) (Oral)  Ht 5\' 2"  (1.575 m)  Wt 187 lb (84.823 kg)  BMI 34.20 kg/m2  LMP 01/26/2012 General appearance: alert, cooperative and no distress Head: Normocephalic, without obvious abnormality, atraumatic Eyes: conjunctivae/corneas clear. PERRL, EOM's intact. Fundi benign. Ears: normal TM's and external ear canals both ears Nose: Nares normal. Septum midline. Mucosa normal. No drainage or sinus tenderness. Throat: abnormal findings: dentition: multiple carries Neck: no adenopathy, no carotid bruit, no JVD, supple, symmetrical, trachea midline and thyroid not enlarged, symmetric, no tenderness/mass/nodules Back: Mild R thoracic scoliosis Lungs: clear to auscultation bilaterally Breasts: normal appearance, no masses or tenderness, Inspection negative, No nipple retraction or dimpling, No nipple discharge or bleeding, No axillary or supraclavicular adenopathy, Normal to palpation without dominant masses, Taught monthly breast self examination Heart: regular rate and rhythm, S1, S2 normal, no murmur, click, rub or gallop Abdomen: soft, non-tender; bowel sounds normal; no masses,  no organomegaly Pelvic: cervix normal in appearance, external genitalia normal, no adnexal masses or tenderness, no cervical motion tenderness, rectovaginal septum normal, uterus normal size, shape, and consistency and vagina normal without discharge Pap done.  Extremities: extremities normal, atraumatic, no cyanosis or edema Skin: acne on face, back and butt. Bruise on middle L leg Neurologic: Grossly normal  PHQ-9: score of 1. 1 to question 3. Not difficult at all to function. GAD-7: score of 2. 1 to questions 3 and 4. Not difficult at all to function.      Assessment and Plan:

## 2012-01-26 NOTE — Patient Instructions (Addendum)
Cassandra Bass,  Thank you for coming in today.   Your breast exam was normal.  I will call with results of your pap smear. If it is normal you will get another one in 3 years.   I will review your records from [facility].   For acne: Use a cleanser (body wash or bar soap) with salicylic acid.   Regarding xanax and tylenol #3 I recommend discontinuing use. These are not medications that I will refill at this time.  F/u in 4 weeks for additional concerns (bruising)  Plan on repeat physical in one year.   Dr. Armen Pickup

## 2012-01-27 ENCOUNTER — Encounter: Payer: Self-pay | Admitting: Family Medicine

## 2012-01-27 DIAGNOSIS — L709 Acne, unspecified: Secondary | ICD-10-CM | POA: Insufficient documentation

## 2012-01-27 DIAGNOSIS — Z0000 Encounter for general adult medical examination without abnormal findings: Secondary | ICD-10-CM | POA: Insufficient documentation

## 2012-01-27 DIAGNOSIS — M545 Low back pain: Secondary | ICD-10-CM | POA: Insufficient documentation

## 2012-01-27 NOTE — Assessment & Plan Note (Signed)
A: on face and body P: salicylic acid body wash.

## 2012-01-27 NOTE — Assessment & Plan Note (Signed)
A: well controlled on celexa only. Improved PHQ-9 from last assessment. P: continue celexa.

## 2012-01-27 NOTE — Assessment & Plan Note (Signed)
A: chronic. Reassuring history and exam. Patient does have mild scoliosis but no enough to account for degree of pain.  I suspect obesity is the major factor. P: Advised patient to d/c tylenol #3 Will obtain and review images from [facility] clinic. I suspect weight loss and core strengthening will be most beneficial.

## 2012-01-27 NOTE — Assessment & Plan Note (Signed)
A: contemplative about quitting.  P:  Encouraged patient to think about the reasons to quit: decrease cancer risk etc. F/u in 4 weeks.

## 2012-01-27 NOTE — Assessment & Plan Note (Signed)
Flu shot and pap done today.

## 2012-01-27 NOTE — Assessment & Plan Note (Signed)
A: negative U preg today. P: depo administered.

## 2012-01-28 ENCOUNTER — Encounter: Payer: Self-pay | Admitting: Family Medicine

## 2012-02-25 ENCOUNTER — Ambulatory Visit: Payer: Medicaid Other | Admitting: Family Medicine

## 2012-03-15 ENCOUNTER — Ambulatory Visit (INDEPENDENT_AMBULATORY_CARE_PROVIDER_SITE_OTHER): Payer: Medicaid Other | Admitting: Family Medicine

## 2012-03-15 ENCOUNTER — Encounter: Payer: Self-pay | Admitting: Family Medicine

## 2012-03-15 VITALS — BP 123/77 | HR 77 | Temp 97.8°F | Ht 62.0 in | Wt 186.0 lb

## 2012-03-15 DIAGNOSIS — R238 Other skin changes: Secondary | ICD-10-CM

## 2012-03-15 DIAGNOSIS — R233 Spontaneous ecchymoses: Secondary | ICD-10-CM | POA: Insufficient documentation

## 2012-03-15 DIAGNOSIS — G44209 Tension-type headache, unspecified, not intractable: Secondary | ICD-10-CM

## 2012-03-15 DIAGNOSIS — R1013 Epigastric pain: Secondary | ICD-10-CM

## 2012-03-15 LAB — CBC
HCT: 38.2 % (ref 36.0–46.0)
MCHC: 33.5 g/dL (ref 30.0–36.0)
RDW: 14.6 % (ref 11.5–15.5)
WBC: 7.9 10*3/uL (ref 4.0–10.5)

## 2012-03-15 LAB — POCT INR: INR: 1

## 2012-03-15 LAB — POCT H PYLORI SCREEN: H Pylori Screen, POC: NEGATIVE

## 2012-03-15 MED ORDER — CITALOPRAM HYDROBROMIDE 40 MG PO TABS: 20.0000 mg | ORAL_TABLET | Freq: Every day | ORAL | Status: DC

## 2012-03-15 MED ORDER — KETOROLAC TROMETHAMINE 60 MG/2ML IM SOLN
60.0000 mg | Freq: Once | INTRAMUSCULAR | Status: AC
  Administered 2012-03-15: 60 mg via INTRAMUSCULAR

## 2012-03-15 NOTE — Patient Instructions (Addendum)
Cassandra Bass,  Thank you for coming in today.   For your abdominal pain:  Will check H. Pylori. If normal, will prescribe a PPI for gastritis.  Recommend cutting back on cigarettes as well.   For menstrual bleeding: due to depo. Will check CBC to make sure you have not become anemic.  Easy bruising: CBC, INR. If labs normal, no additional work up needed.  Headache: depo can worsen headaches. Today you got a large dose of NSAID. Give this a few hours to see if it works. If not. Try excedrin migraine for one or two doses. If that does not work, call in.   Anxiety and depression: you are at the max dose of celexa. You are still have symptoms, so we can switch to a different medication like zoloft. You cannot take them both at the same time. To switch decrease celexa to 20 mg daily for one week, then 20 mg every other day for two weeks. Then come back to see me to get started on zoloft.   Dr. Armen Pickup

## 2012-03-15 NOTE — Progress Notes (Signed)
Subjective:     Patient ID: Cassandra Bass, female   DOB: 1985-07-17, 27 y.o.   MRN: [id number]  HPI 27 yo F presents for same day visit to discuss the following:  1. Vaginal bleeding: daily vaginal bleeding requiring pads since depo shot. Bleeding stopped two days ago. Patient previously had amenorrhea on depo. She denies SOB, palpitations, presyncope and fatigue.   2. Abdominal pain: epigastric pain, non-radiating, sharp. Comes and goes. Worse at night. Worse when drinking alcohol. Not affected by tomato, orange juice. Mild nausea. No vomiting or fever.   3. Headache: started yesterday. R temple. 4/10 pain. Pain is worse when standing and better with Excedrin and lying down. Occasionally has R parietal numbness. Denies vision changes, focal weakness, numbness in other areas and speech changes. Denies head trauma. Reports a personal history of migraines that usually respond well to Excedrin.   4. Easy bruising: since child. Denies easy bleeding. Not taking blood thinners. Denies history of bleeding into joints.   5. Anxiety: reports anxiety attack last month. She went to [facility]. Work up was negative. Anxious about her health. Denies home/work stressors. Compliant with celexa 40 mg and xanax prn, prescribed by previous PCP at [facility].   Review of Systems As per HPI     Objective:   Physical Exam BP 123/77  Pulse 77  Temp(Src) 97.8 F (36.6 C) (Oral)  Ht 5\' 2"  (1.575 m)  Wt 186 lb (84.369 kg)  BMI 34.01 kg/m2  LMP 02/29/2012 General appearance: alert, cooperative and no distress Head: Normocephalic, without obvious abnormality, atraumatic Eyes: conjunctivae/corneas clear. PERRL, EOM's intact.  Abdomen: soft, epigastric discomfort with palpation but non tender,  bowel sounds normal; no masses,  no organomegaly Neurologic: Grossly normal     Assessment and Plan:

## 2012-03-16 ENCOUNTER — Telehealth: Payer: Self-pay | Admitting: Family Medicine

## 2012-03-16 MED ORDER — PANTOPRAZOLE SODIUM 20 MG PO TBEC: 20.0000 mg | DELAYED_RELEASE_TABLET | Freq: Every day | ORAL | Status: DC

## 2012-03-16 NOTE — Telephone Encounter (Signed)
Called patient. Left VM. Labs normal. Sent protonix to pharmacy for what sounds like reflux.

## 2012-03-16 NOTE — Assessment & Plan Note (Addendum)
A: persistent anxiety on celexa for depression. P:  Taper off celexa per AVS, decreased to 20 mg daily.  Advised patient to take xanax prn until off celexa and zoloft can be initiated.  Start zoloft once patient tapered off celexa.

## 2012-03-16 NOTE — Assessment & Plan Note (Addendum)
A: features of tension vs. Migraine headache. More consistent with tension given level of pain and features. May be secondary to depo.  P: NSAID given in office.  Recommend patient try Excedrin migraine.  If no pain relief will try phenergan and steroid for persistent migraine.

## 2012-03-16 NOTE — Assessment & Plan Note (Signed)
Normal platelets and INR. No history of significant bleed. No further work up needed.

## 2012-03-16 NOTE — Assessment & Plan Note (Signed)
A: suspect GERD. H. Pylori negative.  P: Advised decreasing smoking Prescribed protonix for one month trial

## 2012-03-28 ENCOUNTER — Ambulatory Visit (INDEPENDENT_AMBULATORY_CARE_PROVIDER_SITE_OTHER): Payer: Medicaid Other | Admitting: Family Medicine

## 2012-03-28 ENCOUNTER — Encounter: Payer: Self-pay | Admitting: Family Medicine

## 2012-03-28 VITALS — BP 128/80 | HR 73 | Temp 98.1°F | Ht 62.0 in | Wt 184.0 lb

## 2012-03-28 DIAGNOSIS — G44209 Tension-type headache, unspecified, not intractable: Secondary | ICD-10-CM

## 2012-03-28 MED ORDER — PROMETHAZINE HCL 25 MG/ML IJ SOLN
12.5000 mg | Freq: Once | INTRAMUSCULAR | Status: AC
  Administered 2012-03-28: 12.5 mg via INTRAMUSCULAR

## 2012-03-28 MED ORDER — PREDNISONE 50 MG PO TABS: 50.0000 mg | ORAL_TABLET | Freq: Every day | ORAL | Status: DC

## 2012-03-28 MED ORDER — METHYLPREDNISOLONE ACETATE 40 MG/ML IJ SUSP
40.0000 mg | Freq: Once | INTRAMUSCULAR | Status: AC
  Administered 2012-03-28: 40 mg via INTRAMUSCULAR

## 2012-03-28 MED ORDER — ASPIRIN 325 MG PO TABS
975.0000 mg | ORAL_TABLET | Freq: Once | ORAL | Status: AC
  Administered 2012-03-28: 975 mg via ORAL

## 2012-03-28 NOTE — Assessment & Plan Note (Signed)
Feels ready to quit.  Would like to try E cig Advised to look up [place] Quit line

## 2012-03-28 NOTE — Patient Instructions (Addendum)
You are having a migraine You need to try to avoid your headache triggers Please take the prednisone for 3-5 doses.  You may use aspirin 975mg  about once a week if needed for future migraines You need to rest If your headache worsens or does not improve within a few days you need to call Dr. Armen Pickup as you may need a more thorough workup  Migraine Headache A migraine headache is an intense, throbbing pain on one or both sides of your head. A migraine can last for 30 minutes to several hours. CAUSES  The exact cause of a migraine headache is not always known. However, a migraine may be caused when nerves in the brain become irritated and release chemicals that cause inflammation. This causes pain. SYMPTOMS  Pain on one or both sides of your head.  Pulsating or throbbing pain.  Severe pain that prevents daily activities.  Pain that is aggravated by any physical activity.  Nausea, vomiting, or both.  Dizziness.  Pain with exposure to bright lights, loud noises, or activity.  General sensitivity to bright lights, loud noises, or smells. Before you get a migraine, you may get warning signs that a migraine is coming (aura). An aura may include:  Seeing flashing lights.  Seeing bright spots, halos, or zig-zag lines.  Having tunnel vision or blurred vision.  Having feelings of numbness or tingling.  Having trouble talking.  Having muscle weakness. MIGRAINE TRIGGERS  Alcohol.  Smoking.  Stress.  Menstruation.  Aged cheeses.  Foods or drinks that contain nitrates, glutamate, aspartame, or tyramine.  Lack of sleep.  Chocolate.  Caffeine.  Hunger.  Physical exertion.  Fatigue.  Medicines used to treat chest pain (nitroglycerine), birth control pills, estrogen, and some blood pressure medicines. DIAGNOSIS  A migraine headache is often diagnosed based on:  Symptoms.  Physical examination.  A CT scan or MRI of your head. TREATMENT Medicines may be given  for pain and nausea. Medicines can also be given to help prevent recurrent migraines.  HOME CARE INSTRUCTIONS  Only take over-the-counter or prescription medicines for pain or discomfort as directed by your caregiver. The use of long-term narcotics is not recommended.  Lie down in a dark, quiet room when you have a migraine.  Keep a journal to find out what may trigger your migraine headaches. For example, write down:  What you eat and drink.  How much sleep you get.  Any change to your diet or medicines.  Limit alcohol consumption.  Quit smoking if you smoke.  Get 7 to 9 hours of sleep, or as recommended by your caregiver.  Limit stress.  Keep lights dim if bright lights bother you and make your migraines worse. SEEK IMMEDIATE MEDICAL CARE IF:   Your migraine becomes severe.  You have a fever.  You have a stiff neck.  You have vision loss.  You have muscular weakness or loss of muscle control.  You start losing your balance or have trouble walking.  You feel faint or pass out.  You have severe symptoms that are different from your first symptoms. MAKE SURE YOU:   Understand these instructions.  Will watch your condition.  Will get help right away if you are not doing well or get worse. Document Released: 12/28/2004 Document Revised: 03/22/2011 Document Reviewed: 12/18/2010 [facility] Patient Information 2013 [place], [place].

## 2012-03-28 NOTE — Progress Notes (Signed)
Cassandra Bass is a 27 y.o. female who presents to [facility] today for headache  HA: Seen on 3/6/ for HA. Given Toradol and tried exedrine, tylenol w/o benefit. Went to [facility] on 03/23/12 and was given fioricet w/o improvement. HA started on 03/14/12. Described as sharp. Goes from temple to temple. Occasionally will wake up in the middle of the night. Associated w/ watery eyes, dizziness. Denies n/v/d/c, fever, constipation, change in vision or sensation, syncope, decreased strength. Improved only by vicodin given by family member. Vicodin relieved HA for entire day. Aggrevated by TV, light, noise. Has had HA like this before. Long history of intermittent headaches. Previously treated w/ excedrin x2 Qday 3-4 days out of the week. Also recently cut back dramatically on amount of caffinated beverages she is consuming.  Quit drinking 2-3wks ago.   Still smoking but trying to quit. Wants to try the E cig. Has not tried the [place] quitline. SMokes 1/3ppd.    The following portions of the patient's history were reviewed and updated as appropriate: allergies, current medications, past medical history, family and social history, and problem list.  Patient is a non-smoker  Past Medical History  Diagnosis Date  . Depression     ROS as above otherwise neg.    Medications reviewed. Current Outpatient Prescriptions  Medication Sig Dispense Refill  . acetaminophen-codeine (TYLENOL #3) 300-30 MG per tablet Take 1 tablet by mouth 2 (two) times daily. Prescribed at [facility]      . alprazolam (XANAX) 2 MG tablet Take 2 mg by mouth 2 (two) times daily. Prescribed at [facility] for grief      . citalopram (CELEXA) 40 MG tablet Take 0.5 tablets (20 mg total) by mouth daily.  90 tablet  3  . HYDROcodone-acetaminophen (NORCO) 10-325 MG per tablet Take 1 tablet by mouth every 6 (six) hours as needed. Prescribed by her dentist      . medroxyPROGESTERone (DEPO-PROVERA) 150 MG/ML injection  Inject 1 mL (150 mg total) into the muscle every 3 (three) months.  1 mL  6  . pantoprazole (PROTONIX) 20 MG tablet Take 1 tablet (20 mg total) by mouth daily.  30 tablet  0   No current facility-administered medications for this visit.    Exam: BP 128/80  Pulse 73  Temp(Src) 98.1 F (36.7 C) (Oral)  Ht 5\' 2"  (1.575 m)  Wt 184 lb (83.462 kg)  BMI 33.65 kg/m2  LMP 02/29/2012 Gen: Well NAD HEENT: EOMI,  MMM, No papilledema on fundoscopic exam.  Neuro: CN 2-12 intact. Cerebellar fxn nml. Proprioception intact.    No results found for this or any previous visit (from the past 72 hour(s)).

## 2012-03-28 NOTE — Assessment & Plan Note (Addendum)
Likely Migraine at this point.  Long h/o HA. No neurological deficits. If HA persists may more thorough workup. Recent CT scan reported to be normal at [facility]. Will fill out release of medical information form to get results HA exacerbated by recent ETOH abstinence and medication overuse (Excedrine). Phenergan 12.5, Depo 40, ASA 975 today in office for abortive therapy Todays therapy not likely to work as pt w/ migraine now for 12 days. Prednisone for 3-5 days total (including today) Pt instructed to avoid Vicodin and Excedrin at this time Pt to try ASA 975 in future   May need triptan for future abortive therapy

## 2012-04-07 ENCOUNTER — Encounter: Payer: Self-pay | Admitting: Family Medicine

## 2012-04-07 ENCOUNTER — Ambulatory Visit (INDEPENDENT_AMBULATORY_CARE_PROVIDER_SITE_OTHER): Payer: Medicaid Other | Admitting: Family Medicine

## 2012-04-07 VITALS — BP 136/85 | HR 78 | Temp 97.9°F | Ht 62.0 in | Wt 187.0 lb

## 2012-04-07 DIAGNOSIS — M545 Low back pain: Secondary | ICD-10-CM

## 2012-04-07 DIAGNOSIS — G44209 Tension-type headache, unspecified, not intractable: Secondary | ICD-10-CM

## 2012-04-07 MED ORDER — TRAZODONE HCL 50 MG PO TABS: 25.0000 mg | ORAL_TABLET | Freq: Every evening | ORAL | Status: DC | PRN

## 2012-04-07 NOTE — Patient Instructions (Addendum)
Cassandra Bass,  Thank you for coming in today.  For you cold: Pick up and use nasal saline  Try honey and zinc-natural anti virals Use cough drops for cough.   For back pain: Start trazodone 1-2 tabs nightly.  For daytime take extra strength tylenol once daily when pain is bad. Try to avoid daily use as it can bring on headaches. PT referral made.  F/u in one month   Dr. Armen Pickup

## 2012-04-07 NOTE — Progress Notes (Signed)
Subjective:     Patient ID: Cassandra Bass, female   DOB: [birth year], 27 y.o.   MRN: [id number]  HPI 27 yo F presents for f/u visit to discuss the following:  1. Headaches: resolved with sterioid.   2. URI symptoms: x 3 days. Dry cough, runny nose. No fever, sick contacts, headache, chest pain or SOB.   3. Chronic back pain: mid to low. Daily. Severe with activity. Relieved with rest. Does not radiate down butt or back. Known scoliosis.   Review of Systems As per HPI     Objective:   Physical Exam BP 136/85  Pulse 78  Temp(Src) 97.9 F (36.6 C) (Oral)  Ht 5\' 2"  (1.575 m)  Wt 187 lb (84.823 kg)  BMI 34.19 kg/m2  LMP 02/29/2012 General appearance: alert, cooperative and no distress Eyes: conjunctivae/corneas clear. PERRL, EOM's intact.  Ears: normal TM's and external ear canals both ears Nose: Nares normal. Septum midline. Mucosa normal. No drainage or sinus tenderness. Throat: lips, mucosa, and tongue normal; teeth and gums normal Back: no tenderness to percussion or palpation, scoliosis Lungs: clear to auscultation bilaterally Heart: regular rate and rhythm, S1, S2 normal, no murmur, click, rub or gallop    Assessment and Plan:

## 2012-04-07 NOTE — Assessment & Plan Note (Signed)
A: chronic pain. No radiculopathy.  P: add trazadone to aid with sleep Referral to PT.

## 2012-04-07 NOTE — Assessment & Plan Note (Signed)
Resolved.  Avoid regular use of OTC analgesics and avoid narcotics as they seem to trigger headaches.

## 2012-04-11 ENCOUNTER — Telehealth: Payer: Self-pay | Admitting: Family Medicine

## 2012-04-11 NOTE — Telephone Encounter (Signed)
Patient was prescribed Trazadone at bedtime/prn but she wants to know that the instructions for prn are.

## 2012-04-11 NOTE — Telephone Encounter (Signed)
Clarified with MD, the PRN means to only take at bedtime if she needs it.  Not to take anytime she needs it.  LMOVM for pt to return call. Nyaja Dubuque, Maryjo Rochester

## 2012-04-11 NOTE — Telephone Encounter (Signed)
Will FWD to Md for clarification.  Emilie Rutter, Darlyne Russian

## 2012-04-12 ENCOUNTER — Ambulatory Visit: Payer: Medicaid Other | Admitting: Family Medicine

## 2012-04-13 ENCOUNTER — Ambulatory Visit (INDEPENDENT_AMBULATORY_CARE_PROVIDER_SITE_OTHER): Payer: Medicaid Other | Admitting: Family Medicine

## 2012-04-13 ENCOUNTER — Encounter: Payer: Self-pay | Admitting: Family Medicine

## 2012-04-13 VITALS — BP 132/80 | HR 79 | Temp 98.3°F | Ht 62.0 in | Wt 186.0 lb

## 2012-04-13 DIAGNOSIS — J069 Acute upper respiratory infection, unspecified: Secondary | ICD-10-CM

## 2012-04-13 DIAGNOSIS — I959 Hypotension, unspecified: Secondary | ICD-10-CM

## 2012-04-13 NOTE — Assessment & Plan Note (Signed)
Hypotension: Asymptomatic.                       I rechecked her BP it improved.                      Continue home BP monitoring.                      RTC to see PMD for reassessment.

## 2012-04-13 NOTE — Progress Notes (Signed)
Subjective:     Patient ID: Cassandra Bass, female   DOB: 1985-06-18, 27 y.o.   MRN: [id number]  Cough This is a new problem. The current episode started in the past 7 days (Started coughing 3 days ago.). The problem has been unchanged. The problem occurs every few minutes. The cough is productive of sputum. Associated symptoms include nasal congestion and rhinorrhea. Pertinent negatives include no chest pain, ear pain, fever, sore throat, shortness of breath or wheezing. Nothing aggravates the symptoms. Risk factors: Son also have cold symptom. She has tried OTC cough suppressant for the symptoms. The treatment provided moderate relief. There is no history of asthma.  Low BP: She stated her BP run low on anf off,at times she would feel dizzy,no fall or LOC.Denies any bleeding.  Past Medical History  Diagnosis Date  . Depression     Review of Systems  Constitutional: Negative for fever.  HENT: Positive for rhinorrhea. Negative for ear pain and sore throat.   Respiratory: Positive for cough. Negative for shortness of breath and wheezing.   Cardiovascular: Negative.  Negative for chest pain.  Gastrointestinal: Negative.   All other systems reviewed and are negative.   Filed Vitals:   04/13/12 1416 04/13/12 1433  BP: 92/55 132/80  Pulse: 79   Temp: 98.3 F (36.8 C)   TempSrc: Oral   Height: 5\' 2"  (1.575 m)   Weight: 186 lb (84.369 kg)   SpO2: 98%         Objective:   Physical Exam  Nursing note and vitals reviewed. Constitutional: She is oriented to person, place, and time. She appears well-developed. No distress.  Eyes: Conjunctivae are normal.  Neck: Neck supple.  Cardiovascular: Normal rate, regular rhythm, normal heart sounds and intact distal pulses.   No murmur heard. Pulmonary/Chest: Effort normal and breath sounds normal. No respiratory distress. She has no wheezes.  Abdominal: Soft. Bowel sounds are normal.  Neurological: She is alert and oriented to person, place,  and time.           Assessment/Plan:     URI:                  Likely viral infection                          Continue OTC cough med prn.                          RTC soon if symptom persist. Hypotension: Asymptomatic.                       I rechecked her BP it improved.                      Continue home BP monitoring.                      RTC to see PMD for reassessment.

## 2012-04-13 NOTE — Assessment & Plan Note (Signed)
URI:                  Likely viral infection                          Continue OTC cough med prn.                          RTC soon if symptom persist.

## 2012-04-13 NOTE — Patient Instructions (Addendum)

## 2012-04-17 ENCOUNTER — Telehealth: Payer: Self-pay | Admitting: Family Medicine

## 2012-04-17 MED ORDER — TRAMADOL HCL 50 MG PO TABS: 50.0000 mg | ORAL_TABLET | Freq: Three times a day (TID) | ORAL | Status: DC | PRN

## 2012-04-17 MED ORDER — CYCLOBENZAPRINE HCL 10 MG PO TABS: 10.0000 mg | ORAL_TABLET | Freq: Every day | ORAL | Status: DC

## 2012-04-17 NOTE — Telephone Encounter (Signed)
Pt states that the pain medicine, trazodone,  is not working and wants to know if she can have something stronger.

## 2012-04-17 NOTE — Telephone Encounter (Signed)
Called patient flexeril q HS and tramadol q 8 prn sent in for recurrent back pain.

## 2012-05-08 ENCOUNTER — Ambulatory Visit: Payer: Medicaid Other | Admitting: Family Medicine

## 2012-05-12 ENCOUNTER — Telehealth: Payer: Self-pay | Admitting: Family Medicine

## 2012-05-12 DIAGNOSIS — F329 Major depressive disorder, single episode, unspecified: Secondary | ICD-10-CM

## 2012-05-12 MED ORDER — CITALOPRAM HYDROBROMIDE 40 MG PO TABS: 20.0000 mg | ORAL_TABLET | Freq: Every day | ORAL | Status: DC

## 2012-05-12 NOTE — Telephone Encounter (Signed)
Spoke with pt, she decreased the celexa after appt on 03/15/12 but then "the doctor did not say anything about it at my next appt so I started taking the whole pill again".  Pts last dose was yesterday, but she took the 1/2 pill today as directed on new pill.  Spoke with Dr. Leveda Anna, he oks pt to take whole 40mg s but to make appt with MD.  Pt informed and appt made for  May 24, 2012. Pt agreeable to plan. Cassandra Bass, Maryjo Rochester   FYI: called pharmacist and explained to him what was going on asked him to cancel refills called in on previous authorization.  He will cancel now. Cassandra Bass, Maryjo Rochester

## 2012-05-12 NOTE — Telephone Encounter (Signed)
Pt is taking 40 mg daily and she is asking why she is to take 1/2?

## 2012-05-12 NOTE — Telephone Encounter (Signed)
Pt is needing refill on her Celexa and has changed pharmacy - pt took her last one yesterday [facility] - [phone]

## 2012-05-12 NOTE — Telephone Encounter (Signed)
Changed in MR. Fleeger, Cassandra Bass

## 2012-05-12 NOTE — Telephone Encounter (Signed)
Refill of celexa sen tin.  Please inform patient.

## 2012-05-12 NOTE — Telephone Encounter (Signed)
Rx did not go thru electronically, called in verbally.  Had to be changed to #45 tablets in order for medicaid to cover it. Anastasia Tompson, Maryjo Rochester

## 2012-05-12 NOTE — Addendum Note (Signed)
Addended by: Jone Baseman D on: 05/12/2012 04:37 PM   Modules accepted: Orders

## 2012-05-12 NOTE — Telephone Encounter (Signed)
Left message for pt that rx was sent into pharmacy.  

## 2012-05-24 ENCOUNTER — Ambulatory Visit: Payer: Medicaid Other | Admitting: Family Medicine

## 2012-06-27 ENCOUNTER — Ambulatory Visit (INDEPENDENT_AMBULATORY_CARE_PROVIDER_SITE_OTHER): Payer: Medicaid Other | Admitting: Family Medicine

## 2012-06-27 ENCOUNTER — Encounter: Payer: Self-pay | Admitting: Family Medicine

## 2012-06-27 VITALS — BP 124/83 | HR 76 | Ht 65.0 in | Wt 192.0 lb

## 2012-06-27 DIAGNOSIS — F330 Major depressive disorder, recurrent, mild: Secondary | ICD-10-CM

## 2012-06-27 DIAGNOSIS — N912 Amenorrhea, unspecified: Secondary | ICD-10-CM

## 2012-06-27 DIAGNOSIS — M25511 Pain in right shoulder: Secondary | ICD-10-CM

## 2012-06-27 DIAGNOSIS — M25519 Pain in unspecified shoulder: Secondary | ICD-10-CM

## 2012-06-27 DIAGNOSIS — F329 Major depressive disorder, single episode, unspecified: Secondary | ICD-10-CM

## 2012-06-27 DIAGNOSIS — Z3049 Encounter for surveillance of other contraceptives: Secondary | ICD-10-CM

## 2012-06-27 DIAGNOSIS — R1013 Epigastric pain: Secondary | ICD-10-CM

## 2012-06-27 DIAGNOSIS — Z3042 Encounter for surveillance of injectable contraceptive: Secondary | ICD-10-CM

## 2012-06-27 LAB — POCT URINE PREGNANCY: Preg Test, Ur: NEGATIVE

## 2012-06-27 MED ORDER — CITALOPRAM HYDROBROMIDE 40 MG PO TABS: 40.0000 mg | ORAL_TABLET | Freq: Every day | ORAL | Status: DC

## 2012-06-27 MED ORDER — CYCLOBENZAPRINE HCL 10 MG PO TABS: 10.0000 mg | ORAL_TABLET | Freq: Every day | ORAL | Status: DC

## 2012-06-27 MED ORDER — FAMOTIDINE 40 MG PO TABS: 40.0000 mg | ORAL_TABLET | Freq: Every day | ORAL | Status: DC

## 2012-06-27 MED ORDER — MELOXICAM 15 MG PO TABS: 15.0000 mg | ORAL_TABLET | Freq: Every day | ORAL | Status: DC

## 2012-06-27 NOTE — Patient Instructions (Addendum)
Cassandra Bass,  Thank you for coming in today. Please go to [facility] imaging for your shoulder x-ray. F/u with sports medicine.   Use condoms with sex. Call if you would like nuva ring or other birth control.  Dr. Armen Pickup

## 2012-06-30 DIAGNOSIS — N912 Amenorrhea, unspecified: Secondary | ICD-10-CM | POA: Insufficient documentation

## 2012-06-30 NOTE — Assessment & Plan Note (Signed)
A: improved with pepcid. P: refilled pepcid, increased dose.

## 2012-06-30 NOTE — Assessment & Plan Note (Signed)
A: compliant with celexa and tolerating it well. P: refilled celexa.

## 2012-06-30 NOTE — Assessment & Plan Note (Signed)
A: negative u preg. Suspect amenorrhea from depo.  P:  Discussed contraception options. Patient elects to use condoms for now.

## 2012-06-30 NOTE — Assessment & Plan Note (Signed)
A: suspect supraspinatus strain. Possible subacromial bursitis.  P:  Pain control with mobic and flexeril. Referral to [facility] for ultrasound evaluation.

## 2012-06-30 NOTE — Progress Notes (Signed)
Subjective:     Patient ID: Cassandra Bass, female   DOB: 1985-02-08, 27 y.o.   MRN: [id number]  HPI 27 year old female presents for follow visit to discuss the following  #1 Depression:Patient is taking Celexa 40 mg daily. She feels that this is controlling her mood. We had initially talked about decreasing Celexa she felt was not helping now sure to continue. She denies suicidal ideation. She's due for a refill.  #2 Right shoulder pain: Patient complains of posterior right shoulder pain. She denies new or recent injury. The pain is in the upper shoulder worse with lying on that side and with heavy lifting. She denies associated weakness. She is taking Tylenol without significant relief the pain. She requests additional pain medication a refill for Flexeril.  #3 Amenorrhea: Patient has had hepatojugular. She has decided not to continue to do to persistent spotting. She is currently 6 active and not using birth control. She last had intercourse 2 weeks ago and she has not had a recent. Her last spotting was 2 months ago.   Review of Systems As per HPI  Patient is out of pepcid and experiencing reflux and mild epigastric pain.     Objective:   Physical Exam BP 124/83  Pulse 76  Ht 5\' 5"  (1.651 m)  Wt 192 lb (87.091 kg)  BMI 31.95 kg/m2 General appearance: alert, cooperative and no distress Neurologic: Grossly normal R Shoulder: Inspection reveals no abnormalities, atrophy or asymmetry. Palpation is normal with no tenderness over AC joint or bicipital groove. ROM is full in all planes. Rotator cuff strength is strength is full. Pain is reproduced with Hawkins test.  Negative empty can. Neurovascularly intact distal exam.   POCT u preg test: negative     Assessment and Plan:

## 2012-09-25 ENCOUNTER — Telehealth: Payer: Self-pay | Admitting: Family Medicine

## 2012-09-25 NOTE — Telephone Encounter (Signed)
Number in chart has been disconnected.  Pt has history of not taking celexa like she is supposed to (see previous phone note).  Since I am unable to reach patient and she has an appt on 09/29/12, will forward to MD. Milas Gain, Maryjo Rochester

## 2012-09-25 NOTE — Telephone Encounter (Signed)
Patient needs refill on Celexa. Been out x 2 days. States she called pharmacy but they told her to call us. Advised patient that refill request come strictly from pharmacy.

## 2012-09-25 NOTE — Telephone Encounter (Signed)
Pt called back with correct contact number and to inquire about rx request.

## 2012-09-25 NOTE — Telephone Encounter (Signed)
Waiting to hear from pcp about medication refill.  There is already a previous phone encounter.  Sabryn Preslar,CMA

## 2012-09-27 ENCOUNTER — Other Ambulatory Visit: Payer: Self-pay | Admitting: Family Medicine

## 2012-09-27 DIAGNOSIS — F329 Major depressive disorder, single episode, unspecified: Secondary | ICD-10-CM

## 2012-09-27 MED ORDER — CITALOPRAM HYDROBROMIDE 40 MG PO TABS: 40.0000 mg | ORAL_TABLET | Freq: Every day | ORAL | Status: DC

## 2012-09-27 NOTE — Telephone Encounter (Signed)
Refilled 3 months of celexa but would like patient to see me in clinic before this refill runs out for re-evaluation. Thanks Charlane Ferretti MD

## 2012-09-29 ENCOUNTER — Ambulatory Visit (INDEPENDENT_AMBULATORY_CARE_PROVIDER_SITE_OTHER): Payer: Medicaid Other | Admitting: Family Medicine

## 2012-09-29 ENCOUNTER — Encounter: Payer: Self-pay | Admitting: Family Medicine

## 2012-09-29 VITALS — BP 113/76 | HR 87 | Temp 98.4°F | Wt 192.0 lb

## 2012-09-29 DIAGNOSIS — M25511 Pain in right shoulder: Secondary | ICD-10-CM

## 2012-09-29 DIAGNOSIS — M25519 Pain in unspecified shoulder: Secondary | ICD-10-CM

## 2012-09-29 DIAGNOSIS — F330 Major depressive disorder, recurrent, mild: Secondary | ICD-10-CM

## 2012-09-29 DIAGNOSIS — F329 Major depressive disorder, single episode, unspecified: Secondary | ICD-10-CM

## 2012-09-29 MED ORDER — CYCLOBENZAPRINE HCL 10 MG PO TABS: 10.0000 mg | ORAL_TABLET | Freq: Every day | ORAL | Status: DC

## 2012-09-29 NOTE — Progress Notes (Signed)
Patient ID: Cassandra Bass, female   DOB: [birth year], 27 y.o.   MRN: [id number] [facility] Charlane Ferretti, MD Phone: [phone]  Subjective:   # medication refill -hx of depression, on celexa for the past 3-4 years, stable, reports 100% med compliance -mother passed away 1 year ago after rapid decline 3 wks from breast cancer? Liver cirrhosis? -has not had psycho therapy ever  #Rshoulder pain -no trauma -hx of lumbar sclosis -has difficulty with vaccuuming house, has tried flexeril and mobic previously with little relief, seems to have had a flare in teh last 1-2 weeks, notes numbness and tingling down arm  All systems were reviewed and were negative unless otherwise noted in the HPI  Past Medical History Patient Active Problem List   Diagnosis Date Noted  . Amenorrhea 06/30/2012  . Right shoulder pain 06/27/2012  . Hypotension, unspecified 04/13/2012  . Easy bruising 03/15/2012  . GERD (gastroesophageal reflux disease) 03/15/2012  . Tension headache 03/15/2012  . Low back pain 01/27/2012  . Acne 01/27/2012  . Healthcare maintenance 01/27/2012  . Anxiety 06/30/2010  . 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 TOBACCO USE 04/18/2007   Reviewed problem list.  Medications- reviewed and updated Chief complaint-noted No additions to family history Social history- patient is a current every day smoker  Objective: BP 113/76  Pulse 87  Temp(Src) 98.4 F (36.9 C) (Oral)  Wt 192 lb (87.091 kg)  BMI 31.95 kg/m2  LMP 09/29/2012 Gen: NAD, alert, cooperative with exam HEENT: NCAT,  Neck: FROM, supple, post spurlings MSK: neg empty can, speeds, neers, hawings, full ROM, no shoulder instability, muscle tightness on right side, pain with abduction at shoulder and internal rotation Neuro: Alert and oriented, No gross deficits Skin: no rashes no lesions  Assessment/Plan: See problem based a/p

## 2012-09-29 NOTE — Assessment & Plan Note (Signed)
A: stable, last dose of celexa on sat (has been out for several days), does not desire psych therapy at this time P: continue celexa 40mg , offered resources to pt should she ever desire to talk with therapist

## 2012-09-29 NOTE — Assessment & Plan Note (Signed)
A: no trauma, neg for rotator cuff pathology, no true weakness hx of parasthesias, concern for nerve impingement, post spurlings P: xray c-spine and right shoulder Flexeril Ice cups, icy hot 2-4 weeks for reassessment and possible PT eval

## 2012-09-29 NOTE — Patient Instructions (Signed)
Sydell it was nice to meet you today, I am glad you came in! I am so sorry to hear about your mother. If you ever would like to talk with anyone about your stress at home or difficulty coping with every day activities please let me know. For now continue the celexa, let us know if it is not there in the pharmacy  You can go across the street for xrays of your spine and shoulder. I would continue the muscle relaxant if it seems to help you. But you should also try ice cups on your shoulder and icy hot to help relieve some muscle tightness there. If ibuprofen helps you can try that.  I will see you back here in 2weeks-1 month to discuss how these therapies are going or sooner if needed. We can discuss a referral to physical to physical therapy at that time.  Charlane Ferretti, MD

## 2012-10-04 ENCOUNTER — Encounter: Payer: Self-pay | Admitting: Family Medicine

## 2012-10-04 ENCOUNTER — Ambulatory Visit (HOSPITAL_COMMUNITY)
Admission: RE | Admit: 2012-10-04 | Discharge: 2012-10-04 | Disposition: A | Discharge status: Home / Self Care | Payer: Medicaid Other | Source: Ambulatory Visit | Attending: Family Medicine | Admitting: Family Medicine

## 2012-10-04 ENCOUNTER — Telehealth: Payer: Self-pay | Admitting: Family Medicine

## 2012-10-04 DIAGNOSIS — M25519 Pain in unspecified shoulder: Secondary | ICD-10-CM | POA: Insufficient documentation

## 2012-10-04 DIAGNOSIS — M25511 Pain in right shoulder: Secondary | ICD-10-CM

## 2012-10-04 NOTE — Telephone Encounter (Signed)
Talked to pt about Xray result. No acute abnormality noted. Very reassuring. She will use icy hot and ice cups which we discussed at last visit and she has not done at this time.   Also concerned about mothers cirrhosis and her recent elevated liver tests at [facility]. Have requested records but have not received at this time. Will f/up at next visit.   Charlane Ferretti, MD

## 2012-11-03 ENCOUNTER — Ambulatory Visit (INDEPENDENT_AMBULATORY_CARE_PROVIDER_SITE_OTHER): Payer: Medicaid Other | Admitting: Family Medicine

## 2012-11-03 ENCOUNTER — Encounter: Payer: Self-pay | Admitting: Family Medicine

## 2012-11-03 VITALS — HR 99 | Temp 98.2°F | Wt 190.0 lb

## 2012-11-03 DIAGNOSIS — R51 Headache: Secondary | ICD-10-CM

## 2012-11-03 DIAGNOSIS — M25519 Pain in unspecified shoulder: Secondary | ICD-10-CM

## 2012-11-03 DIAGNOSIS — Z23 Encounter for immunization: Secondary | ICD-10-CM

## 2012-11-03 DIAGNOSIS — E669 Obesity, unspecified: Secondary | ICD-10-CM

## 2012-11-03 DIAGNOSIS — M25511 Pain in right shoulder: Secondary | ICD-10-CM

## 2012-11-03 DIAGNOSIS — M545 Low back pain: Secondary | ICD-10-CM

## 2012-11-03 MED ORDER — MELOXICAM 15 MG PO TABS: 15.0000 mg | ORAL_TABLET | Freq: Every day | ORAL | Status: AC

## 2012-11-03 MED ORDER — SUMATRIPTAN SUCCINATE 50 MG PO TABS: 50.0000 mg | ORAL_TABLET | ORAL | Status: DC | PRN

## 2012-11-03 NOTE — Progress Notes (Signed)
Subjective:     Patient ID: Cassandra Bass, female   DOB: 11-12-1985, 27 y.o.   MRN: [id number]  HPI Comments: Headache: Constant daily HAs for the past month with severe HA several times a month. Severe HAs described as Rt temporal pounding with nausea w/o vomiting, and photophobia. Has been taking Excedrin several times a day for the last month. No known triggers. No double vision, fevers, or weakness.   Lower back pain: Reports daily back pain for the past 3.5 years. Described as sharp pain in the lumbar area and said she was told she has scoliosis. Back pain interferes with daily activities: mopping, yard work, playing with her 59 yr old son. No Hx of trauma, fevers, saddle anesthesia, urinary incontinence, or sciatica.   Back Pain This is a chronic problem. The current episode started more than 1 year ago. The problem is unchanged. The pain is present in the lumbar spine. The quality of the pain is described as stabbing. The pain does not radiate. The pain is moderate. The symptoms are aggravated by bending and standing. Pertinent negatives include no bladder incontinence, bowel incontinence, chest pain, dysuria, leg pain, paresis or paresthesias. Risk factors include lack of exercise, obesity and sedentary lifestyle. She has tried NSAIDs and muscle relaxant for the symptoms. The treatment provided no relief.     Review of Systems  Constitutional: Positive for activity change. Negative for appetite change and unexpected weight change.  Eyes: Negative for discharge.  Cardiovascular: Negative for chest pain and palpitations.  Gastrointestinal: Negative for bowel incontinence.  Genitourinary: Negative for bladder incontinence, dysuria and flank pain.  Musculoskeletal: Positive for myalgias. Negative for gait problem, joint swelling and neck stiffness.  Skin: Negative for rash.  Neurological: Negative for syncope, facial asymmetry, light-headedness and paresthesias.  Psychiatric/Behavioral:  Positive for dysphoric mood.       Objective:   Physical Exam  Constitutional: She is oriented to person, place, and time. She appears well-developed and well-nourished. No distress.  HENT:  Head: Normocephalic and atraumatic.  Right Ear: External ear normal.  Left Ear: External ear normal.  Mouth/Throat: No oropharyngeal exudate.  Eyes: Conjunctivae and EOM are normal. Pupils are equal, round, and reactive to light. Right eye exhibits no discharge. Left eye exhibits no discharge.  Neck: Normal range of motion. Neck supple. No thyromegaly present.  Cardiovascular: Normal rate, regular rhythm and normal heart sounds.   No murmur heard. Pulmonary/Chest: Effort normal and breath sounds normal.  Musculoskeletal:  Paraspinal tenderness b/l at L1-L2. No spinal tenderness. No straight leg & cross leg raise.   Lymphadenopathy:    She has no cervical adenopathy.  Neurological: She is alert and oriented to person, place, and time. No cranial nerve deficit. She exhibits normal muscle tone. Coordination normal.  Skin: Skin is warm. No rash noted. She is not diaphoretic. No erythema.       Assessment/Plan:     See Problem Focused Assessment & Plan

## 2012-11-03 NOTE — Patient Instructions (Signed)
It was great seeing you today. Below is a list of the things we talked about:   1. Start Mobic: Take every morning for your back pain 2. Referral to Physical therapy was made today, and they will call to schedule your visit. Continue to do the exercises and stretches they recommend 3. Take Sumatriptan for your Migraine headaches: DO NOT TAKE FOR DAILY HEADACHE OR TAKE MORE THAN TWICE A WEEK 4. Stop take OTC headache medications  Please bring all your medications to every doctors visit  Sign up for My Chart to have easy access to your labs results, and communication with your Primary care physician.   Please check-out at the front desk before leaving the clinic.   Make an appointment with Dr Michail Jewels in 1 month.   I look forward to talking with you again at our next visit. If you have any questions or concerns before then, please call the clinic at [phone].  Take Care,   Dr Wenda Low

## 2012-11-03 NOTE — Assessment & Plan Note (Signed)
A: Chronic daily HAs mostly likely medication overuse/rebound with intermittent Migraine spikes; No red flags P: Advised stopped Excedrin (was using 6 x a day for > 60month); Sumatriptan 50mg  given for Migraine spikes; Mobic for back pain should provide additional benefit

## 2012-11-03 NOTE — Assessment & Plan Note (Signed)
A: Lumbar musculoskeletal strain; no red flags; mild scoliosis on imaging with NO disk space narrowing P: Referral to PT, Refilled Mobic Rx, Advised heating pads, stretching and exercise; Trigger point injection today b/l @ L1 - consider changing Celexa to SNRI   Procedure Note: Trigger Point Injection Informed consent obtained  Area prepped with betadine swabs and alcohol swabs. Injections performed using 3 mL of 2% plain lidocaine without epinephrine at L1 3 inches lateral to spinal b/l Patient tolerated the procedure well. Hemostasis achieved immediately.  Site of injection covered with band-aid.

## 2012-11-04 LAB — COMPREHENSIVE METABOLIC PANEL
ALT: 14 U/L (ref 0–35)
AST: 12 U/L (ref 0–37)
Albumin: 4.3 g/dL (ref 3.5–5.2)
Calcium: 9.8 mg/dL (ref 8.4–10.5)
Chloride: 101 mEq/L (ref 96–112)
Glucose, Bld: 82 mg/dL (ref 70–99)
Potassium: 4.8 mEq/L (ref 3.5–5.3)
Sodium: 134 mEq/L — ABNORMAL LOW (ref 135–145)

## 2012-11-08 ENCOUNTER — Telehealth: Payer: Self-pay

## 2012-11-08 NOTE — Telephone Encounter (Signed)
Patient calls inquiring test results from OV 10/24. Please advise patient.

## 2012-11-09 NOTE — Telephone Encounter (Signed)
Pt is glad to know that labs look good, but states that her injection didn't help at all.  She is still having pain and never felt any relief from injection. Illene Sweeting,CMA

## 2012-11-09 NOTE — Telephone Encounter (Signed)
Would you mind letting her know that her AST/ALT (which are markers of her liver function) looked completely normal so she can be reassured! I hope she is feeling better overall after the injection from Dr. Gayla Doss [facility], [place]

## 2012-11-10 ENCOUNTER — Encounter: Payer: Self-pay | Admitting: Family Medicine

## 2012-11-10 NOTE — Telephone Encounter (Signed)
I am very optimistic that the referral that Dr. Gayla Doss made to physical therapy will be of tremendous benefit. She should continue with alternating heat and ice and icy hot cream to help with msk aches until she can be seen there. I am sorry she is still in discomfort. [facility], MD

## 2012-12-05 ENCOUNTER — Ambulatory Visit: Payer: Medicaid Other | Admitting: Family Medicine

## 2012-12-13 ENCOUNTER — Telehealth: Payer: Self-pay | Admitting: Family Medicine

## 2012-12-13 ENCOUNTER — Ambulatory Visit (INDEPENDENT_AMBULATORY_CARE_PROVIDER_SITE_OTHER): Payer: Medicaid Other | Admitting: Family Medicine

## 2012-12-13 VITALS — BP 121/61 | HR 80 | Temp 98.1°F | Ht 62.0 in | Wt 195.0 lb

## 2012-12-13 DIAGNOSIS — L404 Guttate psoriasis: Secondary | ICD-10-CM | POA: Insufficient documentation

## 2012-12-13 DIAGNOSIS — L408 Other psoriasis: Secondary | ICD-10-CM

## 2012-12-13 MED ORDER — CLOBETASOL PROPIONATE 0.05 % EX GEL: CUTANEOUS | Status: DC

## 2012-12-13 MED ORDER — FLUOCINONIDE 0.05 % EX CREA: 1.0000 "application " | TOPICAL_CREAM | Freq: Every day | CUTANEOUS | Status: AC

## 2012-12-13 MED ORDER — HYDROXYZINE HCL 10 MG PO TABS: 10.0000 mg | ORAL_TABLET | Freq: Two times a day (BID) | ORAL | Status: AC | PRN

## 2012-12-13 MED ORDER — HYDROCORTISONE 2.5 % EX CREA: TOPICAL_CREAM | Freq: Every day | CUTANEOUS | Status: DC

## 2012-12-13 NOTE — Patient Instructions (Addendum)
It was good to meet you.   I think you have psoriasis. I am including information below.  Apply lidex once daily to your body. Apply hydrocortisone to underarms, under breasts, and upper thighs. Apply temovate gel to your scalp once daily. Take atarax twice daily as needed for itching. Be aware that it will make you drowsy, so do not operate heavy machinery on this.  Make follow up with your PCP in 1-2 weeks to see how the rash is doing. Seek immediate care if you develop shortness of breath, rash in your eyes, or other concerning symptoms.  Cassandra Singleton, MD

## 2012-12-13 NOTE — Assessment & Plan Note (Addendum)
With no blisters, with erythema and scaling, likely a papular squamous process, most likely psoriasis with patches/plaques. Tinea, lupus, pityriasis rubra, mycosis fungiodes, parapsoriasis also possible, reiter syndrome unlikely given no joint symptoms. No new exposures per pt except for pet but timing does not correlate. Unlikely bugbites with household contacts not affected. - Will dose topical steroids (lidex cream for body, hydrocortisone cream for intertriginous areas, temovate gel for scalp), atarax for itching PRN. - Will check ANA, ASO titer, and RPR (did not order, will order as future and ask pt to return). - F/u with PCP 1-2 weeks. - If worsening, may need derm referral. - Return precautions reviewed.

## 2012-12-13 NOTE — Progress Notes (Signed)
Patient ID: Cassandra Bass, female   DOB: 1985-11-29, 27 y.o.   MRN: [id number] Subjective:   CC: Rash  HPI:   1. Sameday for rash  Rash Patient presents for evaluation of a rash involving the Head, chest, arms, back, under breasts, bottom, abdomen, legs. None in mouth, vagina, anus, or eyes.. Rash on head started 2 month ago. Rash on arms started ~2 days ago. Lesions are pink, and raised in texture. Rash has changed over time (started out on head, 2days ago appeared on body and has worsened (spread, gotten more itchy and painful)). Rash is painful and is pruritic. Associated symptoms: none. Patient denies: abdominal pain, arthralgia, congestion, cough, crankiness, decrease in appetite, decrease in energy level, fever, headache, irritability, myalgia, nausea, sore throat and vomiting. Patient has not had contacts with similar rash. Boyfriend and son share same bed with patient and do not have similar rash. Patient has not had new exposures (soaps, lotions, laundry detergents, foods, medications, plants, insects). Got a new dog 3 weeks ago.  Never had similar symptoms. Denies recent illness/cold.   Review of Systems - Per HPI.   PMH: Acne Anxiety/depression Easy bruising  Objective:  Physical Exam BP 121/61  Pulse 80  Temp(Src) 98.1 F (36.7 C) (Oral)  Ht 5\' 2"  (1.575 m)  Wt 195 lb (88.451 kg)  BMI 35.66 kg/m2 GEN: NAD HEENT: Atraumatic, normocephalic, neck supple, EOMI, sclera clear, MMM with no lesions PULM: normal effort ABD: Soft, nontender, nondistended, NABS, no organomegaly SKIN: Diffuse maculopapular rash on arms, back, abdomen, legs that varies from 1mm to 2cm in diameter, with intertriginous confluence into patches, blanching, nonvesicular, sharp margins. Scalp with silvery/scaly plaque and flaking skin. No rash in gluteal cleft or vagina. Warm and well-perfused EXTR: No lower extremity edema or calf tenderness, no joint effusions or erythema PSYCH: Mood and affect  euthymic, normal rate and volume of speech NEURO: Awake, alert, no focal deficits grossly, normal speech    Assessment:     Gerturde Bass is a 27 y.o. female here for rash.    Plan:     # See problem list and after visit summary for problem-specific plans.  # Health Maintenance: Not addressed.  Follow-up: Follow up in 1-2 weeks for follow-up.    Leona Singleton, MD [facility]

## 2012-12-13 NOTE — Telephone Encounter (Signed)
Please call pt and ask to make lab-only appt for ANA, ASO titer, and RPR (I had meant to order while she was here but forgot). Thank you.  Leona Singleton, MD

## 2012-12-14 ENCOUNTER — Telehealth: Payer: Self-pay | Admitting: *Deleted

## 2012-12-14 NOTE — Telephone Encounter (Signed)
appt made Cassandra Bass Cassandra Bass  

## 2012-12-14 NOTE — Telephone Encounter (Signed)
Pharmacy calling wanting verbal authorization to change temovate gel to temovate solution. Will forward to MD.

## 2012-12-14 NOTE — Telephone Encounter (Signed)
Verbal consent given. Cassandra Bass,CMA

## 2012-12-14 NOTE — Telephone Encounter (Signed)
This should be okay. Can you call back? Leona Singleton, MD 12/14/2012 4:22 PM

## 2012-12-15 ENCOUNTER — Other Ambulatory Visit: Payer: Medicaid Other

## 2012-12-29 ENCOUNTER — Ambulatory Visit (INDEPENDENT_AMBULATORY_CARE_PROVIDER_SITE_OTHER): Payer: Medicaid Other | Admitting: Family Medicine

## 2012-12-29 ENCOUNTER — Encounter: Payer: Self-pay | Admitting: Family Medicine

## 2012-12-29 VITALS — BP 112/70 | HR 78 | Temp 97.9°F | Ht 64.0 in | Wt 192.0 lb

## 2012-12-29 DIAGNOSIS — R51 Headache: Secondary | ICD-10-CM

## 2012-12-29 DIAGNOSIS — L404 Guttate psoriasis: Secondary | ICD-10-CM

## 2012-12-29 DIAGNOSIS — L408 Other psoriasis: Secondary | ICD-10-CM

## 2012-12-29 DIAGNOSIS — R519 Headache, unspecified: Secondary | ICD-10-CM

## 2012-12-29 DIAGNOSIS — F329 Major depressive disorder, single episode, unspecified: Secondary | ICD-10-CM

## 2012-12-29 DIAGNOSIS — F330 Major depressive disorder, recurrent, mild: Secondary | ICD-10-CM

## 2012-12-29 MED ORDER — CLOBETASOL PROPIONATE 0.05 % EX SOLN: 1.0000 "application " | Freq: Two times a day (BID) | CUTANEOUS | Status: DC

## 2012-12-29 MED ORDER — CITALOPRAM HYDROBROMIDE 40 MG PO TABS: 40.0000 mg | ORAL_TABLET | Freq: Every day | ORAL | Status: DC

## 2012-12-29 MED ORDER — HYDROCORTISONE 2.5 % EX CREA: TOPICAL_CREAM | Freq: Every day | CUTANEOUS | Status: AC

## 2012-12-29 MED ORDER — OMEPRAZOLE 20 MG PO CPDR: 20.0000 mg | DELAYED_RELEASE_CAPSULE | Freq: Every day | ORAL | Status: DC

## 2012-12-29 MED ORDER — SUMATRIPTAN SUCCINATE 50 MG PO TABS: 50.0000 mg | ORAL_TABLET | ORAL | Status: AC | PRN

## 2012-12-29 MED ORDER — ALPRAZOLAM 0.25 MG PO TABS: 0.2500 mg | ORAL_TABLET | Freq: Every evening | ORAL | Status: AC | PRN

## 2012-12-29 NOTE — Patient Instructions (Signed)
Vondell it was great to see you today! I am so sorry to hear that your skin lesions are not improving and thus affecting your mood.  I have put in a referral for dermatology and we will see what those lab tests show today I called in the solution to the pharmacy so it should be there for you to pick up. I also have called in the other meds you requested. Please use the xanax sparingly and only as needed at bedtime, I know this is a difficult time of year for you. Continue to spend time doing the things you enjoy and take time to be with the people that care about you. Here are some other tips for sleeping. I will see you back in 3 months or sooner as needed.   Have a great holiday! Charlane Ferretti , MD  Sleep only as much as you need to feel rested and then get out of bed Keep a regular sleep schedule  Avoid forcing sleep  Exercise regularly for at least 20 minutes, preferably 4 to 5 hours before bedtime  Avoid caffeinated beverages after lunch  Avoid alcohol near bedtime: no "night cap"  Avoid smoking, especially in the evening  Do not go to bed hungry  Adjust bedroom environment Deal with your worries before bedtime  Stimulus control 1. Go to bed only when sleepy. 2. Do not watch television, read, eat, or worry while in bed. Use bed only for sleep and sex. 3. Get out of bed if unable to fall asleep within twenty minutes and go to another room. Return to bed only when sleepy. Repeat this step as many times as necessary throughout the night. 4. Set an alarm clock to wake up at a fixed time each morning including weekends. 5. Do not take a nap during the day.

## 2012-12-29 NOTE — Assessment & Plan Note (Addendum)
A: stable, worse around the holidays when mother died, also assc with insomnia; conts on celexa P: cont celexa (at highest dose currently), given limited rx per xanax per pt request, reviewed signs of serotonin sx, cont to offer pt resources for CBT; encouraged proper sleep hygiene

## 2012-12-29 NOTE — Progress Notes (Signed)
Patient ID: Cassandra Bass, female   DOB: 07/15/1985, 26 y.o.   MRN: [id number] [facility] Charlane Ferretti, MD Phone: [phone]  Subjective:   # f/up rash -not improved since last saw Dr. Benjamin Stain -feels like scalp involvement and itching is worse, otherwise spread is the same, just no better -had not been able to pick up clobetasol because pharm requesting solution vs gel, has been intermittently using topical hydrocort cream  #insomnia/depression -recently has had trouble falling asleep, doesnt get to sleep until 3-4 am and then has frequent nighttime wakenings; endorses anxiety -attests to good sleep hygiene -attests to stress over the holidays 2/2 mothers death around this time last year -compliant with celexa, has not felt need for CBT -no thoughts of suicide or HI -no self mutilating behaviors -depression worse with new onset rash and pt not wanting to leave home   All systems were reviewed and were negative unless otherwise noted in the HPI  Past Medical History Patient Active Problem List   Diagnosis Date Noted  . 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 Obesity, unspecified 11/03/2012  . Headache 11/03/2012  . Amenorrhea 06/30/2012  . Right shoulder pain 06/27/2012  . Hypotension, unspecified 04/13/2012  . Easy bruising 03/15/2012  . GERD (gastroesophageal reflux disease) 03/15/2012  . Tension headache 03/15/2012  . Low back pain 01/27/2012  . Acne 01/27/2012  . Healthcare maintenance 01/27/2012  . Anxiety 06/30/2010  . 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 TOBACCO USE 04/18/2007   Reviewed problem list.  Medications- reviewed and updated Chief complaint-noted No additions to family history Social history- patient is a current every day smoker  Objective: BP 112/70  Pulse 78  Temp(Src) 97.9 F (36.6 C) (Oral)  Ht 5\' 4"  (1.626 m)  Wt 192 lb (87.091 kg)  BMI 32.94 kg/m2  LMP 12/12/2012 Gen: NAD, alert, cooperative with  exam HEENT: NCAT, EOMI,  Neck: FROM, supple  Abd: SNTND, BS present, no guarding or organomegaly Ext: No edema, warm, normal tone, moves UE/LE spontaneously Neuro: Alert and oriented, No gross deficits Skin: diffuse maculopapular rash assc with excoriations, size 35mm-2cm involvement of scalp, non blanching, plaque like lesions, worse on bilateral flanks  Assessment/Plan: See problem based a/p

## 2012-12-29 NOTE — Assessment & Plan Note (Addendum)
A: not really improved, causing pt to feel depressed; taking atarax and using topical hydrocortisol cream; was unable to pick up clobetasol at the pharmacy (wanted solution vs gel) P: Derm referral for possible UV therapy -refilled rx -stress reduction through family support -obtaining ana, aso titer and rpr today (were ordered as future orders last visit)

## 2012-12-30 LAB — RPR

## 2013-01-15 ENCOUNTER — Telehealth: Payer: Self-pay | Admitting: Family Medicine

## 2013-01-15 NOTE — Telephone Encounter (Signed)
Pt called and would like her lab results and also know when she is going to be referred to a dermatologist. Cassandra Jacobsonjw

## 2013-01-15 NOTE — Telephone Encounter (Signed)
Abnormal results will forward to MD.  As far as referral will forward to [employer] (referral coordinator). Noeli Lavery, Maryjo RochesterJessica Dawn

## 2013-01-17 ENCOUNTER — Telehealth: Payer: Self-pay | Admitting: Family Medicine

## 2013-01-17 MED ORDER — CLOBETASOL PROPIONATE 0.05 % EX SOLN: 1.0000 "application " | Freq: Two times a day (BID) | CUTANEOUS | Status: AC

## 2013-01-17 NOTE — Telephone Encounter (Signed)
Pt will like to know the labs result and also pt will like to have refill of her clobetasol (TEMOVATE) 0.05 % external solution for scalp problem.   Cassandra Bass

## 2013-01-17 NOTE — Telephone Encounter (Signed)
Results also forwarded into my inbox. ASO titer elevated, ANA and RPR normal. Will precept this unless Dr Michail JewelsMarsh already has plan for what to do for pt. I think derm referral is appropriate but am uncertain regarding elevated ASO titer.  Leona SingletonMaria T Lamyra Malcolm, MD

## 2013-01-17 NOTE — Telephone Encounter (Signed)
Follow up appt made to see Dr. Michail JewelsMarsh 02/02/2013.  Pt will be calling [facility] to schedule an appt. Jazmin Hartsell,CMA

## 2013-01-17 NOTE — Telephone Encounter (Signed)
Dr Michail JewelsMarsh and I spoke with Dr Mauricio PoBreen who recommended bringing patient back for repeat ASO titer as it can be elevated with recent strep pharyngitis. On f/u, would ask patient if she has had any GU symptoms (hematuria), pharyngitis symptoms, URI symptoms and repeat ASO titer. If still elevated, could consult with ID. Would like to see patient next available appt on [facility], preferably with Dr Michail JewelsMarsh or myself. Thanks.  Leona SingletonMaria T Cassell Voorhies, MD

## 2013-01-17 NOTE — Telephone Encounter (Signed)
Please see message from Dr. Benjamin Stainhekkekandam.  Is there anything I need to add for her?  Please advise on refill also. Jazmin Hartsell,CMA

## 2013-01-17 NOTE — Telephone Encounter (Signed)
Precepting patient with Dr. Mauricio PoBreen for additional suggestions regarding management. As soon as I hear from him I will give her a call with the final recs regarding the ASO titer. Refilled clobetasol at this time. Have we secured this referral to derm? Thanks guys! [facility] L[facility] MD

## 2013-01-18 NOTE — Telephone Encounter (Signed)
Referral is now on pt side. [facility] request pt call to make an appt after we faxed OV note. Pt is aware about it and pt was agree to call dermatology office.   Marines

## 2013-02-02 ENCOUNTER — Ambulatory Visit: Payer: Medicaid Other | Admitting: Family Medicine

## 2013-04-09 ENCOUNTER — Telehealth: Payer: Self-pay | Admitting: Family Medicine

## 2013-04-09 NOTE — Telephone Encounter (Signed)
Pt has been having pain in wrist and then fingers feel numb. Just her right hand. Mostly at night but some during the day. Please advise

## 2013-04-10 NOTE — Telephone Encounter (Signed)
Attempted to call patient. This sounds like carpal tunnel syndrome to me. I wonder if wearing a wrist brace at night time might help. I would also advise her to be conscious about typing or lifting or abnormal motions that cock her wrist back creating pressure on that nerve. She can purchase one of these braces at a [facility] or [facility]. I would also advise icing if needed and ibuprofen. If it gets terrible we could try steroid injections there. [facility], MD

## 2013-05-14 ENCOUNTER — Other Ambulatory Visit: Payer: Self-pay | Admitting: Family Medicine

## 2013-06-27 ENCOUNTER — Telehealth: Payer: Self-pay | Admitting: Family Medicine

## 2013-06-27 NOTE — Telephone Encounter (Signed)
Patient states that Celexa RX was not changed from 1/2 to 1 tab as discussed with Dr. Michail JewelsMarsh. Patient is now down to her last few pills. Would like this corrected.

## 2013-06-28 MED ORDER — CITALOPRAM HYDROBROMIDE 40 MG PO TABS: ORAL_TABLET | ORAL | Status: DC

## 2013-06-28 NOTE — Telephone Encounter (Signed)
Pt is aware of this.  She states that she wants to discuss her headaches at her next visit.  Also she would like to know what she should do about not taking her PPI.  If she doesn't take it her stomach will burn.  Please advise. Jazmin Hartsell,CMA

## 2013-06-28 NOTE — Telephone Encounter (Signed)
No problem. Hopefully she is doing better. Attempted to speak to patient. Reached a unidentified female person. I would recommend she not take PPI while using higher dose of celexa (have not filled this med since decemeber) and use sumatriptan sparringly (adverse drug reactions noted) [facility], MD

## 2013-06-29 NOTE — Telephone Encounter (Signed)
Discussed with Dr. Randolm IdolFletke and Dr. Raymondo BandKoval Can continued omeprazole for now Will cont to monitor for serotonin sx [facility], MD

## 2013-06-29 NOTE — Telephone Encounter (Signed)
Pt is aware of this. Cassandra Bass,CMA  

## 2013-07-09 ENCOUNTER — Ambulatory Visit: Payer: Medicaid Other | Admitting: Family Medicine

## 2013-10-13 IMAGING — CR DG CERVICAL SPINE 2 OR 3 VIEWS
4 series · 4 of 4 positions shown · non-contrast
Comparison: None.

CLINICAL DATA: 27-year-old female with pain radiating to the right
shoulder and upper extremity. Prevertebral soft tissue contour
within

EXAM:
CERVICAL SPINE - 2-3 VIEW

[w c-spine lat]
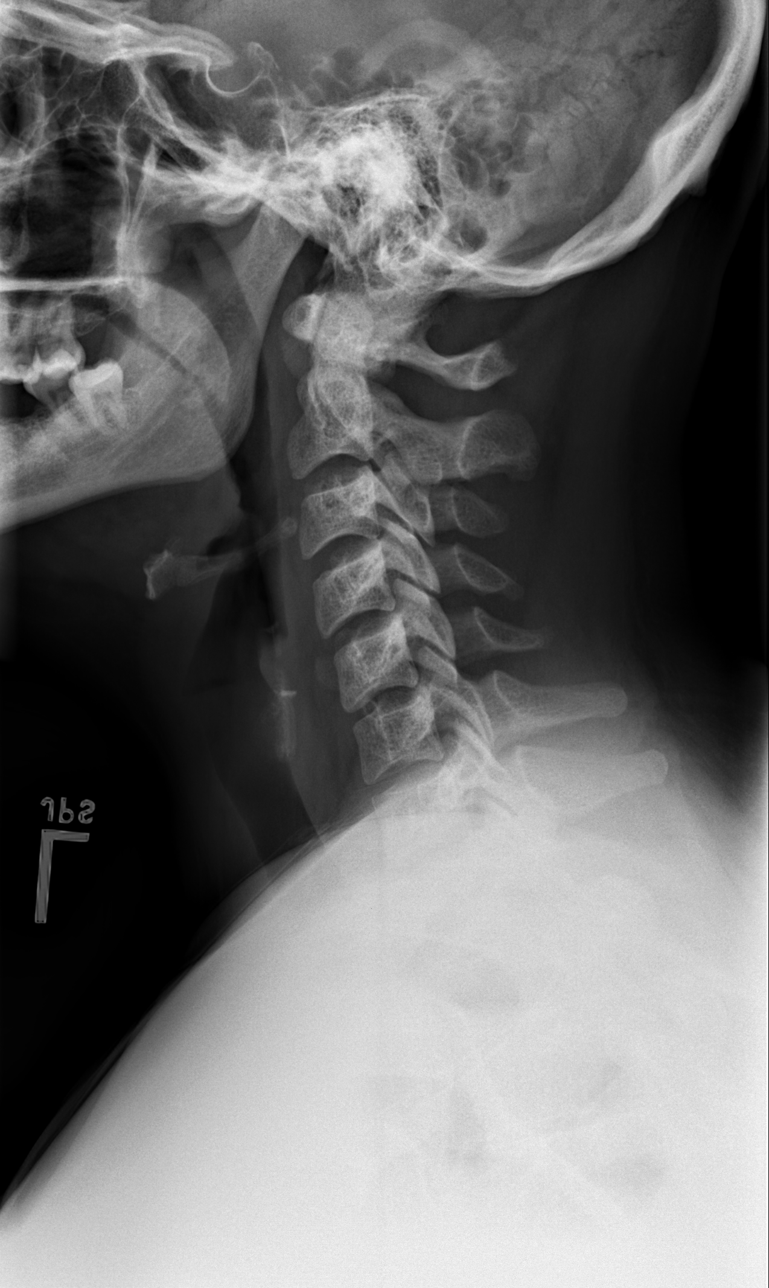

[w swimmers view]
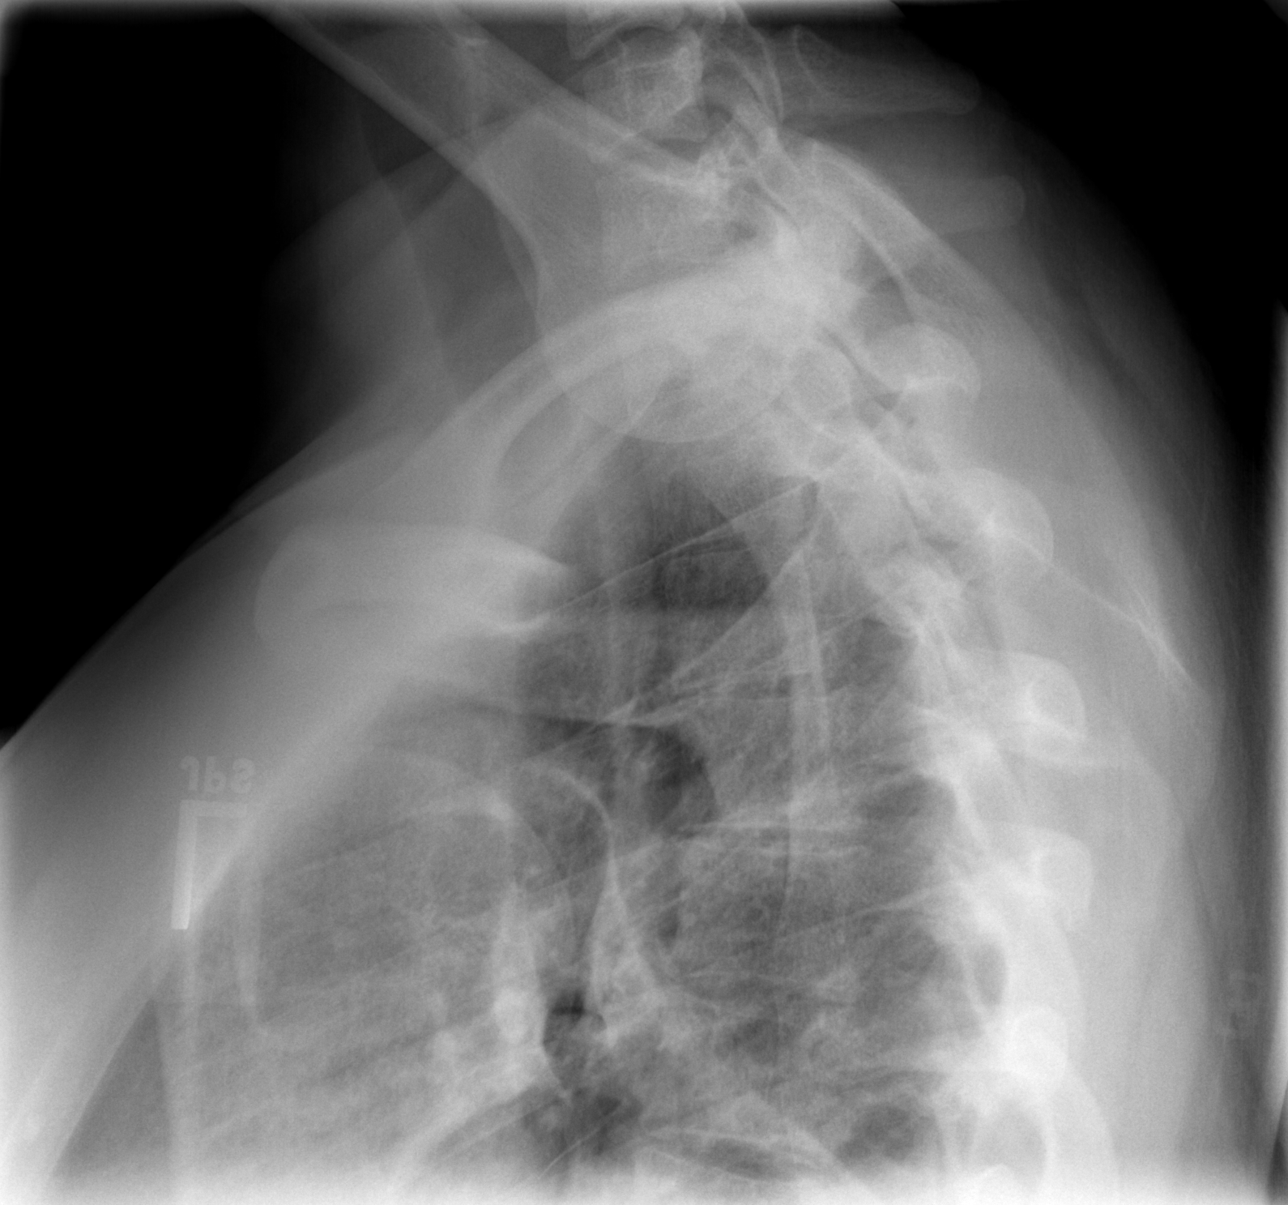

[w c-spine a.p.]
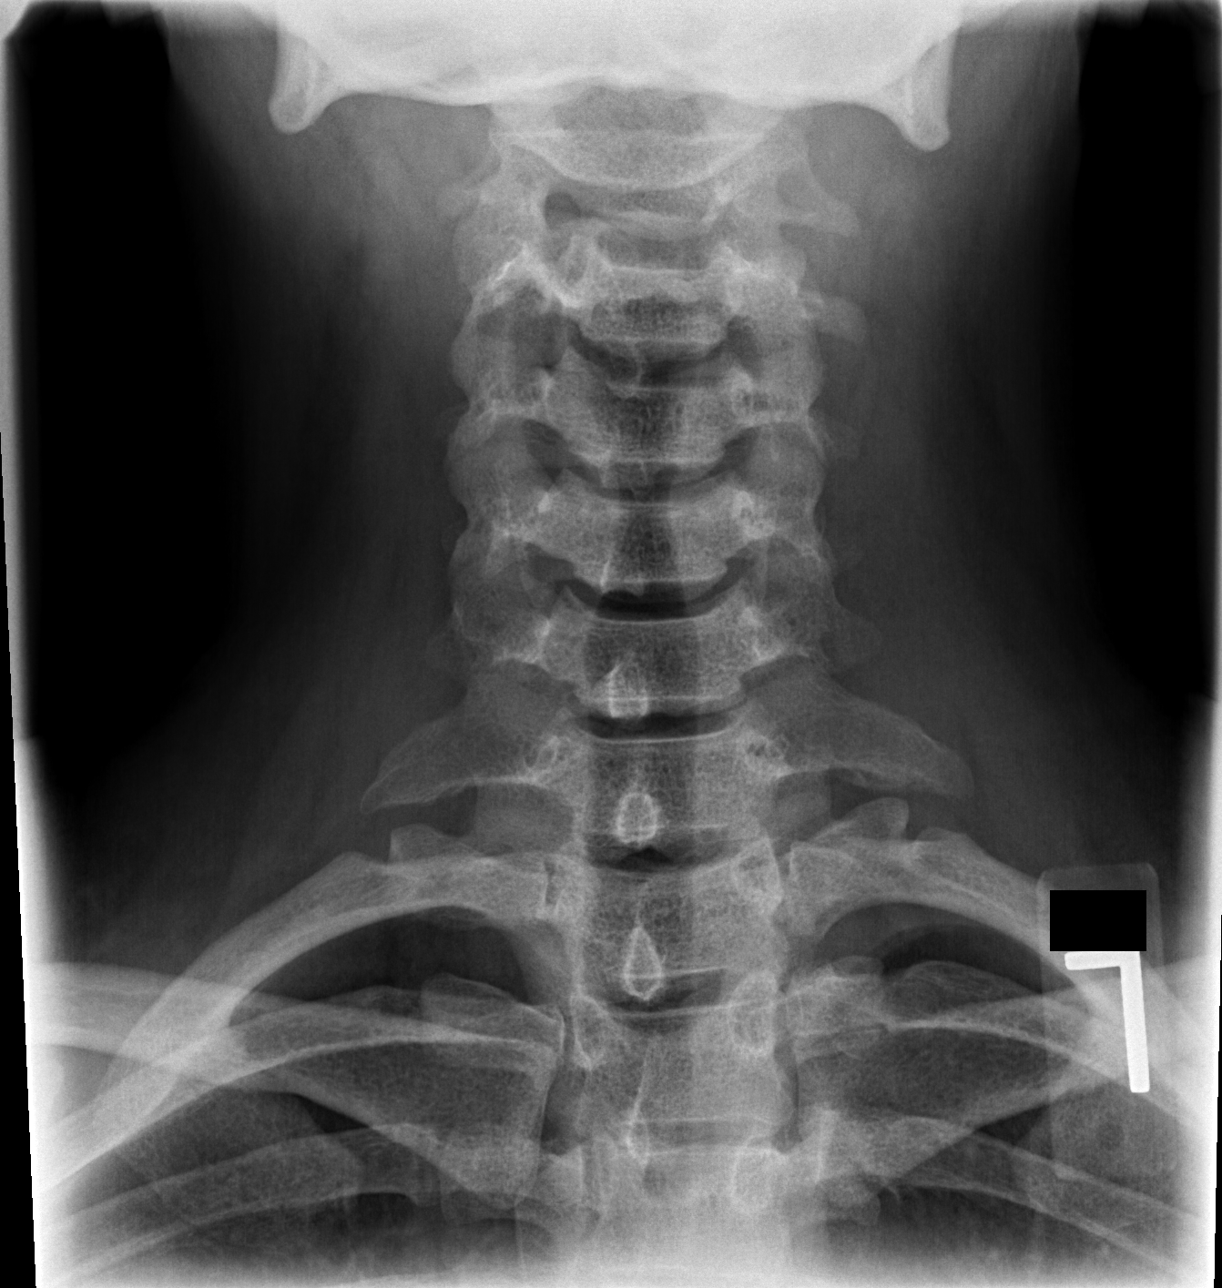

[w c-spine odontoid]
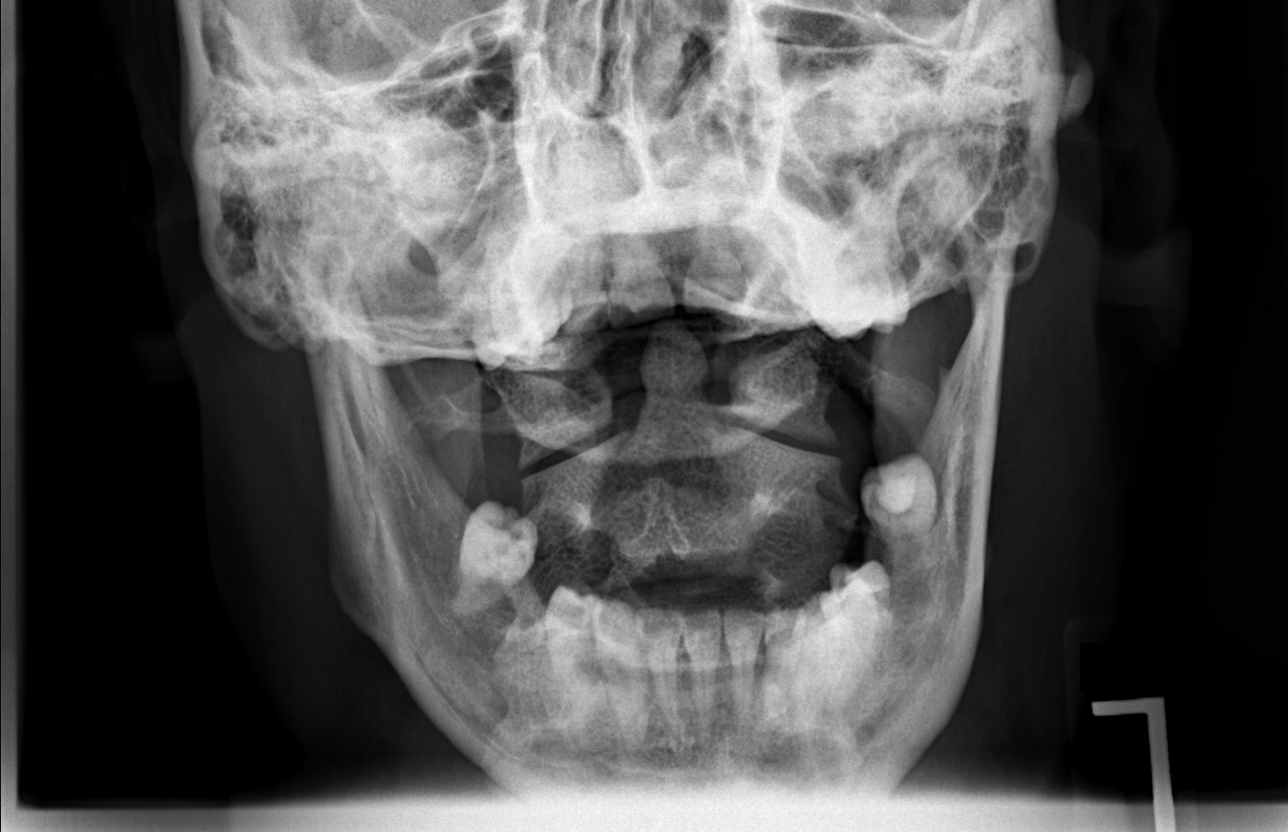

[4 of 4 positions shown; findings below may reference images not displayed]

FINDINGS: Normal limits. Straightening of cervical lordosis. Relatively
preserved disc spaces. Cervicothoracic junction alignment is within
normal limits. Normal AP alignment and lung apices. C1-C2 alignment
and odontoid within normal limits.
IMPRESSION: Negative except for straightening of cervical lordosis.

## 2013-10-25 ENCOUNTER — Emergency Department (HOSPITAL_COMMUNITY)
Admission: EM | Admit: 2013-10-25 | Discharge: 2013-10-25 | Disposition: A | Discharge status: Home / Self Care | Payer: Medicaid Other | Source: Home / Self Care | Attending: Family Medicine | Admitting: Family Medicine

## 2013-10-25 ENCOUNTER — Encounter (HOSPITAL_COMMUNITY): Payer: Self-pay | Admitting: Emergency Medicine

## 2013-10-25 DIAGNOSIS — K0889 Other specified disorders of teeth and supporting structures: Secondary | ICD-10-CM

## 2013-10-25 DIAGNOSIS — K029 Dental caries, unspecified: Secondary | ICD-10-CM

## 2013-10-25 DIAGNOSIS — K088 Other specified disorders of teeth and supporting structures: Secondary | ICD-10-CM

## 2013-10-25 MED ORDER — AMOXICILLIN 500 MG PO CAPS: 500.0000 mg | ORAL_CAPSULE | Freq: Three times a day (TID) | ORAL | Status: AC

## 2013-10-25 NOTE — ED Provider Notes (Signed)
CSN: [id number]     Arrival date & time 10/25/13  1231 History   First MD Initiated Contact with Patient 10/25/13 1245     Chief Complaint  Patient presents with  . Dental Pain   (Consider location/radiation/quality/duration/timing/severity/associated sxs/prior Treatment) HPI     28 year old female presents for evaluation of a toothache. She has a toothache in the right and left lower posterior molars. She has a history of having fillings put in those teeth but they fell out. She called her dentist and make an appointment but they could not see her until next week. She is worried she will get another infection so she wanted to come in to see if she can get started on some antibiotics. No swelling or drainage currently.  Past Medical History  Diagnosis Date  . Depression    History reviewed. No pertinent past surgical history. Family History  Problem Relation Age of Onset  . Cancer Mother 6345    breast cancer   . Cirrhosis Mother 845   History  Substance Use Topics  . Smoking status: Current Every Day Smoker -- 0.30 packs/day    Types: Cigarettes  . Smokeless tobacco: Never Used  . Alcohol Use: Yes     Comment: quit 01/12/2012   OB History   Grav Para Term Preterm Abortions TAB SAB Ect Mult Living   1 1        1      Review of Systems  HENT: Positive for dental problem.   All other systems reviewed and are negative.   Allergies  Review of patient's allergies indicates no known allergies.  Home Medications   Prior to Admission medications   Medication Sig Start Date End Date Taking? Authorizing Provider  ALPRAZolam (XANAX) 0.25 MG tablet Take 1 tablet (0.25 mg total) by mouth at bedtime as needed for anxiety. 12/29/12   Charlane FerrettiMelanie C Marsh, MD  amoxicillin (AMOXIL) 500 MG capsule Take 1 capsule (500 mg total) by mouth 3 (three) times daily. 10/25/13   Graylon GoodZachary H Georga Stys, PA-C  citalopram (CELEXA) 40 MG tablet Take 1 tab by mouth every day 06/28/13   Charlane FerrettiMelanie C Marsh, MD  clobetasol  (TEMOVATE) 0.05 % external solution Apply 1 application topically 2 (two) times daily. 01/17/13   Charlane FerrettiMelanie C Marsh, MD  fluocinonide cream (LIDEX) 0.05 % Apply 1 application topically daily. Apply to arms, legs, abdomen, and back affected areas. 12/13/12   Leona SingletonMaria T Thekkekandam, MD  hydrocortisone 2.5 % cream Apply topically daily. Apply under arms, under breasts, and in upper thighs (not to vagina). 12/29/12   Charlane FerrettiMelanie C Marsh, MD  hydrOXYzine (ATARAX/VISTARIL) 10 MG tablet Take 1 tablet (10 mg total) by mouth 2 (two) times daily as needed. 12/13/12   Leona SingletonMaria T Thekkekandam, MD  meloxicam (MOBIC) 15 MG tablet Take 1 tablet (15 mg total) by mouth daily. 11/03/12   Jamal CollinJames R Joyner, MD  omeprazole (PRILOSEC) 20 MG capsule Take 1 capsule (20 mg total) by mouth daily. 12/29/12   Charlane FerrettiMelanie C Marsh, MD  SUMAtriptan (IMITREX) 50 MG tablet Take 1 tablet (50 mg total) by mouth every 2 (two) hours as needed for migraine. May repeat in 2 hours if headache persists or recurs. 12/29/12   Charlane FerrettiMelanie C Marsh, MD   BP 136/84  Pulse 62  Temp(Src) 97.9 F (36.6 C) (Oral)  Resp 14  SpO2 99%  LMP 10/24/2013 Physical Exam  Nursing note and vitals reviewed. Constitutional: She is oriented to person, place, and time. Vital signs are normal.  She appears well-developed and well-nourished. No distress.  HENT:  Head: Normocephalic and atraumatic.  Mouth/Throat: Dental caries (multiple teeth) present. No dental abscesses.  No facial swelling  Pulmonary/Chest: Effort normal. No respiratory distress.  Neurological: She is alert and oriented to person, place, and time. She has normal strength. Coordination normal.  Skin: Skin is warm and dry. No rash noted. She is not diaphoretic.  Psychiatric: She has a normal mood and affect. Judgment normal.    ED Course  Procedures (including critical care time) Labs Review Labs Reviewed - No data to display  Imaging Review No results found.   MDM   1. Toothache   2. Dental caries     Will prescribe amoxicillin for infection prophylaxis. Followup with dentist  Meds ordered this encounter  Medications  . amoxicillin (AMOXIL) 500 MG capsule    Sig: Take 1 capsule (500 mg total) by mouth 3 (three) times daily.    Dispense:  21 capsule    Refill:  0    Order Specific Question:  Supervising Provider    Answer:  Lorenz CoasterKELLER, DAVID C [[id number]]      Graylon GoodZachary H Branon Sabine, PA-C 10/25/13 1352

## 2013-10-25 NOTE — Discharge Instructions (Signed)
Dental Caries  Dental caries (also called tooth decay) is the most common oral disease. It can occur at any age but is more common in children and young adults.   HOW DENTAL CARIES DEVELOPS   The process of decay begins when bacteria and foods (particularly sugars and starches) combine in your mouth to produce plaque. Plaque is a substance that sticks to the hard, outer surface of a tooth (enamel). The bacteria in plaque produce acids that attack enamel. These acids may also attack the root surface of a tooth (cementum) if it is exposed. Repeated attacks dissolve these surfaces and create holes in the tooth (cavities). If left untreated, the acids destroy the other layers of the tooth.   RISK FACTORS   Frequent sipping of sugary beverages.    Frequent snacking on sugary and starchy foods, especially those that easily get stuck in the teeth.    Poor oral hygiene.    Dry mouth.    Substance abuse such as methamphetamine abuse.    Broken or poor-fitting dental restorations.    Eating disorders.    Gastroesophageal reflux disease (GERD).    Certain radiation treatments to the head and neck.  SYMPTOMS  In the early stages of dental caries, symptoms are seldom present. Sometimes white, chalky areas may be seen on the enamel or other tooth layers. In later stages, symptoms may include:   Pits and holes on the enamel.   Toothache after sweet, hot, or cold foods or drinks are consumed.   Pain around the tooth.   Swelling around the tooth.  DIAGNOSIS   Most of the time, dental caries is detected during a regular dental checkup. A diagnosis is made after a thorough medical and dental history is taken and the surfaces of your teeth are checked for signs of dental caries. Sometimes special instruments, such as lasers, are used to check for dental caries. Dental X-ray exams may be taken so that areas not visible to the eye (such as between the contact areas of the teeth) can be checked for cavities.    TREATMENT   If dental caries is in its early stages, it may be reversed with a fluoride treatment or an application of a remineralizing agent at the dental office. Thorough brushing and flossing at home is needed to aid these treatments. If it is in its later stages, treatment depends on the location and extent of tooth destruction:    If a small area of the tooth has been destroyed, the destroyed area will be removed and cavities will be filled with a material such as gold, silver amalgam, or composite resin.    If a large area of the tooth has been destroyed, the destroyed area will be removed and a cap (crown) will be fitted over the remaining tooth structure.    If the center part of the tooth (pulp) is affected, a procedure called a root canal will be needed before a filling or crown can be placed.    If most of the tooth has been destroyed, the tooth may need to be pulled (extracted).  HOME CARE INSTRUCTIONS  You can prevent, stop, or reverse dental caries at home by practicing good oral hygiene. Good oral hygiene includes:   Thoroughly cleaning your teeth at least twice a day with a toothbrush and dental floss.    Using a fluoride toothpaste. A fluoride mouth rinse may also be used if recommended by your dentist or health care provider.    Restricting   the amount of sugary and starchy foods and sugary liquids you consume.    Avoiding frequent snacking on these foods and sipping of these liquids.    Keeping regular visits with a dentist for checkups and cleanings.  PREVENTION    Practice good oral hygiene.   Consider a dental sealant. A dental sealant is a coating material that is applied by your dentist to the pits and grooves of teeth. The sealant prevents food from being trapped in them. It may protect the teeth for several years.   Ask about fluoride supplements if you live in a community without fluorinated water or with water that has a low fluoride content. Use fluoride supplements  as directed by your dentist or health care provider.   Allow fluoride varnish applications to teeth if directed by your dentist or health care provider.  Document Released: 09/19/2001 Document Revised: 05/14/2013 Document Reviewed: 12/31/2011  [employer] Patient Information 2015 [employer]. This information is not intended to replace advice given to you by your health care provider. Make sure you discuss any questions you have with your health care provider.

## 2013-10-25 NOTE — ED Notes (Signed)
Pt  Reports  Symptoms  Of  A  Toothache  Both  Sides  Upper  Which she  Reports  Started last  Pm           She   Is sitting upright     On  Exam table  Speaking in  Complete   sentances  appeaqring in no  Acute  Distress

## 2013-10-29 NOTE — ED Provider Notes (Signed)
Medical screening examination/treatment/procedure(s) were performed by a resident physician or non-physician practitioner and as the supervising physician I was immediately available for consultation/collaboration.  Zyia Kaneko, MD    Latrice Storlie S Leisha Trinkle, MD 10/29/13 0804 

## 2013-11-12 ENCOUNTER — Encounter (HOSPITAL_COMMUNITY): Payer: Self-pay | Admitting: Emergency Medicine

## 2014-02-22 ENCOUNTER — Other Ambulatory Visit: Payer: Self-pay | Admitting: *Deleted

## 2014-02-22 MED ORDER — OMEPRAZOLE 20 MG PO CPDR: 20.0000 mg | DELAYED_RELEASE_CAPSULE | Freq: Every day | ORAL | Status: AC

## 2014-02-27 ENCOUNTER — Ambulatory Visit: Payer: Medicaid Other | Admitting: Student

## 2014-07-05 ENCOUNTER — Other Ambulatory Visit: Payer: Self-pay | Admitting: Student

## 2014-07-05 NOTE — Telephone Encounter (Signed)
Pt called and needs a refill on her Celexa called in today since she only has 1 pill left. jw

## 2014-07-08 NOTE — Telephone Encounter (Signed)
Pt called because the doctor has not called in her refill. The pharmacy gave her some pills to hold her over the weekend but now she is down to 1 pill. Can we call this in. jw

## 2014-07-08 NOTE — Telephone Encounter (Signed)
Will forward to MD. Roshawna Colclasure,CMA  

## 2014-07-09 ENCOUNTER — Encounter: Payer: Self-pay | Admitting: *Deleted

## 2014-07-09 MED ORDER — CITALOPRAM HYDROBROMIDE 40 MG PO TABS: ORAL_TABLET | ORAL | Status: DC

## 2014-07-09 NOTE — Telephone Encounter (Signed)
Letter mailed to patient. Nivek Powley,CMA  

## 2014-07-09 NOTE — Telephone Encounter (Signed)
Celexa Rx sent to pharmacy. Please inform the pt  Thank you  Kely Dohn A. Kennon RoundsHaney MD, MS Family Medicine Resident PGY-1 Pager [phone]

## 2015-07-21 ENCOUNTER — Other Ambulatory Visit: Payer: Self-pay | Admitting: *Deleted

## 2015-07-21 NOTE — Telephone Encounter (Signed)
Patient states that she is leaving to go out of town today and is out of her citalopram.  She would like to have this filled before she leaves today. Informed patient that I couldn't guarantee she would get this approved today before she leaves.  I did let her know that if PCP writes for 1 month then we would contact patient and see which pharmacy is close to her and we could send it there.  Patient voiced understanding and plans to call for an appt when she gets back into town. Jazmin Hartsell,CMA

## 2015-07-22 MED ORDER — CITALOPRAM HYDROBROMIDE 40 MG PO TABS: ORAL_TABLET | ORAL | Status: DC

## 2015-07-22 NOTE — Telephone Encounter (Signed)
Patient was called regarding her Rx for celexa. She did not answer and did not have a voicemail set up. Will fill to her current pharmacy and will try to reach her about where, if another pharmacy is needed, this should be sent to

## 2015-07-24 ENCOUNTER — Other Ambulatory Visit: Payer: Self-pay | Admitting: Student

## 2015-07-24 MED ORDER — CITALOPRAM HYDROBROMIDE 40 MG PO TABS: ORAL_TABLET | ORAL | Status: DC

## 2015-07-24 NOTE — Telephone Encounter (Signed)
Pt is calling because she is out of the state and will be gone for the next month. She has refills on her Celexa, but she uses a pharmacy that doesn't have out of state locations. She would like the doctor to call in a [facility] [place], the phone number to the pharmacy is [phone]. Please let patient know when this is done. jw

## 2015-07-24 NOTE — Telephone Encounter (Signed)
Medication resent to the other pharmacy and patient aware. Jazmin Hartsell,CMA

## 2016-10-12 ENCOUNTER — Other Ambulatory Visit: Payer: Self-pay | Admitting: *Deleted

## 2016-10-22 ENCOUNTER — Other Ambulatory Visit: Payer: Self-pay | Admitting: Internal Medicine

## 2016-10-22 MED ORDER — CITALOPRAM HYDROBROMIDE 40 MG PO TABS: ORAL_TABLET | ORAL | 0 refills | Status: AC

## 2016-10-22 NOTE — Progress Notes (Signed)
Patient has not been seen in clinic since 06/2013. Her prior assigned PCP provided refills in 07/2015 #90 with 5 refills. I will order 30 day supply. She will need follow up appointment for further refills. Please inform patient

## 2016-10-25 ENCOUNTER — Encounter: Payer: Self-pay | Admitting: *Deleted

## 2016-10-25 NOTE — Progress Notes (Signed)
Tried to contact patient again but no answer or VM.  Letter mailed. Jazmin Hartsell,CMA

## 2016-10-25 NOTE — Progress Notes (Signed)
Tried to call patient but VM was not set up. Will continue to try and call today, if I can't reach her I will mail a letter. Cassandra Bass,CMA
